# Patient Record
Sex: Female | Born: 1950 | Race: White | Hispanic: No | Smoking: Never smoker
Health system: Southern US, Community
[De-identification: ages and names within clinical notes are randomized; demographics above are authoritative.]

## PROBLEM LIST (undated history)

## (undated) DIAGNOSIS — J310 Chronic rhinitis: Secondary | ICD-10-CM

## (undated) DIAGNOSIS — M810 Age-related osteoporosis without current pathological fracture: Secondary | ICD-10-CM

## (undated) DIAGNOSIS — I10 Essential (primary) hypertension: Secondary | ICD-10-CM

## (undated) HISTORY — PX: COLONOSCOPY: SHX174

## (undated) HISTORY — PX: TUBAL LIGATION: SHX77

---

## 2010-11-27 ENCOUNTER — Ambulatory Visit: Payer: Self-pay | Admitting: Gastroenterology

## 2012-05-31 ENCOUNTER — Ambulatory Visit: Payer: Self-pay | Admitting: Family Medicine

## 2012-11-17 ENCOUNTER — Emergency Department: Payer: Self-pay | Admitting: Emergency Medicine

## 2013-06-06 ENCOUNTER — Ambulatory Visit: Payer: Self-pay | Admitting: Family Medicine

## 2014-08-22 ENCOUNTER — Ambulatory Visit: Payer: Self-pay | Admitting: Family Medicine

## 2015-07-28 ENCOUNTER — Other Ambulatory Visit: Payer: Self-pay | Admitting: Family Medicine

## 2015-07-28 DIAGNOSIS — Z1231 Encounter for screening mammogram for malignant neoplasm of breast: Secondary | ICD-10-CM

## 2015-08-25 ENCOUNTER — Ambulatory Visit
Admission: RE | Admit: 2015-08-25 | Discharge: 2015-08-25 | Disposition: A | Discharge status: Home / Self Care | Payer: BLUE CROSS/BLUE SHIELD | Source: Ambulatory Visit | Attending: Family Medicine | Admitting: Family Medicine

## 2015-08-25 ENCOUNTER — Ambulatory Visit: Payer: Self-pay

## 2015-08-25 DIAGNOSIS — Z1231 Encounter for screening mammogram for malignant neoplasm of breast: Secondary | ICD-10-CM | POA: Diagnosis not present

## 2016-02-11 ENCOUNTER — Encounter: Payer: Self-pay | Admitting: *Deleted

## 2016-02-12 ENCOUNTER — Ambulatory Visit
Admission: RE | Admit: 2016-02-12 | Discharge: 2016-02-12 | Disposition: A | Discharge status: Home / Self Care | Payer: BLUE CROSS/BLUE SHIELD | Source: Ambulatory Visit | Attending: Gastroenterology | Admitting: Gastroenterology

## 2016-02-12 ENCOUNTER — Ambulatory Visit: Payer: BLUE CROSS/BLUE SHIELD | Admitting: Anesthesiology

## 2016-02-12 ENCOUNTER — Encounter: Payer: Self-pay | Admitting: *Deleted

## 2016-02-12 ENCOUNTER — Encounter
Admission: RE | Disposition: A | Discharge status: Home / Self Care | Payer: Self-pay | Source: Ambulatory Visit | Attending: Gastroenterology

## 2016-02-12 DIAGNOSIS — Z1211 Encounter for screening for malignant neoplasm of colon: Secondary | ICD-10-CM | POA: Diagnosis not present

## 2016-02-12 DIAGNOSIS — Z79899 Other long term (current) drug therapy: Secondary | ICD-10-CM | POA: Insufficient documentation

## 2016-02-12 DIAGNOSIS — M810 Age-related osteoporosis without current pathological fracture: Secondary | ICD-10-CM | POA: Diagnosis not present

## 2016-02-12 DIAGNOSIS — Z8371 Family history of colonic polyps: Secondary | ICD-10-CM | POA: Insufficient documentation

## 2016-02-12 HISTORY — PX: COLONOSCOPY WITH PROPOFOL: SHX5780

## 2016-02-12 HISTORY — DX: Age-related osteoporosis without current pathological fracture: M81.0

## 2016-02-12 HISTORY — DX: Chronic rhinitis: J31.0

## 2016-02-12 SURGERY — COLONOSCOPY WITH PROPOFOL
Anesthesia: General

## 2016-02-12 MED ORDER — PROPOFOL 10 MG/ML IV BOLUS
INTRAVENOUS | Status: DC | PRN
  Administered 2016-02-12: 30 mg via INTRAVENOUS
  Administered 2016-02-12: 70 mg via INTRAVENOUS

## 2016-02-12 MED ORDER — SODIUM CHLORIDE 0.9 % IV SOLN: INTRAVENOUS | Status: DC

## 2016-02-12 MED ORDER — LIDOCAINE HCL (CARDIAC) 20 MG/ML IV SOLN
INTRAVENOUS | Status: DC | PRN
  Administered 2016-02-12: 50 mg via INTRAVENOUS

## 2016-02-12 MED ORDER — PROPOFOL 500 MG/50ML IV EMUL
INTRAVENOUS | Status: DC | PRN
  Administered 2016-02-12: 120 ug/kg/min via INTRAVENOUS

## 2016-02-12 MED ORDER — SODIUM CHLORIDE 0.9 % IV SOLN
INTRAVENOUS | Status: DC
  Administered 2016-02-12: 09:00:00 via INTRAVENOUS

## 2016-02-12 NOTE — Transfer of Care (Signed)
Immediate Anesthesia Transfer of Care Note  Patient: Paula Acosta  Procedure(s) Performed: Procedure(s): COLONOSCOPY WITH PROPOFOL (N/A)  Patient Location: Endoscopy Unit  Anesthesia Type:General  Level of Consciousness: awake  Airway & Oxygen Therapy: Patient Spontanous Breathing and Patient connected to nasal cannula oxygen  Post-op Assessment: Report given to RN  Post vital signs: Reviewed  Last Vitals:  Filed Vitals:   02/12/16 0907 02/12/16 1004  BP: 145/83 103/57  Pulse: 92 76  Temp: 36.1 C 36.1 C  Resp: 14 14    Last Pain: There were no vitals filed for this visit.       Complications: No apparent anesthesia complications

## 2016-02-12 NOTE — Op Note (Signed)
[facility] Patient Name: Paula Acosta Procedure Date: 02/12/2016 9:48 AM MRN: [id number] Account #: [id number] Date of Birth: 18-Mar-1951 Admit Type: Outpatient Age: 65 Room: [facility] ENDO ROOM 3 Gender: Female Note Status: Finalized Procedure:            Colonoscopy Indications:          Colon cancer screening in patient at increased risk:                        Family history of 1st-degree relative with colon polyps Providers:            Lupita Dawn. Candace Cruise, MD Referring MD:         Dion Body (Referring MD) Medicines:            Monitored Anesthesia Care Complications:        No immediate complications. Procedure:            Pre-Anesthesia Assessment:                       - Prior to the procedure, a History and Physical was                        performed, and patient medications, allergies and                        sensitivities were reviewed. The patient's tolerance of                        previous anesthesia was reviewed.                       - The risks and benefits of the procedure and the                        sedation options and risks were discussed with the                        patient. All questions were answered and informed                        consent was obtained.                       - After reviewing the risks and benefits, the patient                        was deemed in satisfactory condition to undergo the                        procedure.                       After obtaining informed consent, the colonoscope was                        passed under direct vision. Throughout the procedure,                        the patient's blood pressure, pulse, and oxygen  saturations were monitored continuously. The                        Colonoscope was introduced through the anus and                        advanced to the the cecum, identified by appendiceal                        orifice and ileocecal valve.  The colonoscopy was                        performed without difficulty. The patient tolerated the                        procedure well. Findings:      The colon (entire examined portion) appeared normal. Impression:           - The entire examined colon is normal.                       - No specimens collected. Recommendation:       - Discharge patient to home.                       - Repeat colonoscopy in 5 years for surveillance.                       - The findings and recommendations were discussed with                        the patient. Procedure Code(s):    --- Professional ---                       [phone], Colonoscopy, flexible; diagnostic, including                        collection of specimen(s) by brushing or washing, when                        performed (separate procedure) Diagnosis Code(s):    --- Professional ---                       Z83.71, Family history of colonic polyps CPT copyright 2016 American Medical Association. All rights reserved. The codes documented in this report are preliminary and upon coder review may  be revised to meet current compliance requirements. Hulen Luster, MD 02/12/2016 10:05:56 AM This report has been signed electronically. Number of Addenda: 0 Note Initiated On: 02/12/2016 9:48 AM Scope Withdrawal Time: 0 hours 3 minutes 26 seconds  Total Procedure Duration: 0 hours 6 minutes 45 seconds       [facility]

## 2016-02-12 NOTE — Anesthesia Preprocedure Evaluation (Signed)
Anesthesia Evaluation  Patient identified by MRN, date of birth, ID band Patient awake    Reviewed: Allergy & Precautions, H&P , NPO status , Patient's Chart, lab work & pertinent test results, reviewed documented beta blocker date and time   History of Anesthesia Complications Negative for: history of anesthetic complications  Airway Mallampati: II  TM Distance: >3 FB Neck ROM: full    Dental no notable dental hx. (+) Caps, Teeth Intact   Pulmonary neg pulmonary ROS,    Pulmonary exam normal breath sounds clear to auscultation       Cardiovascular Exercise Tolerance: Good negative cardio ROS Normal cardiovascular exam Rhythm:regular Rate:Normal     Neuro/Psych negative neurological ROS  negative psych ROS   GI/Hepatic negative GI ROS, Neg liver ROS,   Endo/Other  negative endocrine ROS  Renal/GU negative Renal ROS  negative genitourinary   Musculoskeletal   Abdominal   Peds  Hematology negative hematology ROS (+)   Anesthesia Other Findings Past Medical History:   Osteoporosis                                                 Rhinitis                                                     Reproductive/Obstetrics negative OB ROS                             Anesthesia Physical Anesthesia Plan  ASA: I  Anesthesia Plan: General   Post-op Pain Management:    Induction:   Airway Management Planned:   Additional Equipment:   Intra-op Plan:   Post-operative Plan:   Informed Consent: I have reviewed the patients History and Physical, chart, labs and discussed the procedure including the risks, benefits and alternatives for the proposed anesthesia with the patient or authorized representative who has indicated his/her understanding and acceptance.   Dental Advisory Given  Plan Discussed with: Anesthesiologist, CRNA and Surgeon  Anesthesia Plan Comments:         Anesthesia  Quick Evaluation

## 2016-02-12 NOTE — Anesthesia Postprocedure Evaluation (Signed)
Anesthesia Post Note  Patient: Paula Acosta  Procedure(s) Performed: Procedure(s) (LRB): COLONOSCOPY WITH PROPOFOL (N/A)  Patient location during evaluation: Endoscopy Anesthesia Type: General Level of consciousness: awake and alert Pain management: pain level controlled Vital Signs Assessment: post-procedure vital signs reviewed and stable Respiratory status: spontaneous breathing, nonlabored ventilation, respiratory function stable and patient connected to nasal cannula oxygen Cardiovascular status: blood pressure returned to baseline and stable Postop Assessment: no signs of nausea or vomiting Anesthetic complications: no    Last Vitals:  Filed Vitals:   02/12/16 1020 02/12/16 1030  BP: 118/74 141/80  Pulse:    Temp:    Resp:      Last Pain: There were no vitals filed for this visit.               Martha Clan

## 2016-02-12 NOTE — H&P (Signed)
    Primary Care Physician:  Dion Body, MD Primary Gastroenterologist:  Dr. Candace Cruise  Pre-Procedure History & Physical: HPI:  Paula Acosta is a 65 y.o. female is here for an colonoscopy   Past Medical History  Diagnosis Date  . Osteoporosis   . Rhinitis     Past Surgical History  Procedure Laterality Date  . Tubal ligation    . Colonoscopy      Prior to Admission medications   Medication Sig Start Date End Date Taking? Authorizing Provider  Calcium Carbonate (CALTRATE 600 PO) Take by mouth.   Yes Historical Provider, MD  docusate sodium (COLACE) 100 MG capsule Take 100 mg by mouth 2 (two) times daily.   Yes Historical Provider, MD  Multiple Vitamin (MULTIVITAMIN) capsule Take 1 capsule by mouth daily.   Yes Historical Provider, MD    Allergies as of 02/02/2016  . (Not on File)    History reviewed. No pertinent family history.  Social History   Social History  . Marital Status: Divorced    Spouse Name: N/A  . Number of Children: N/A  . Years of Education: N/A   Occupational History  . Not on file.   Social History Main Topics  . Smoking status: Never Smoker   . Smokeless tobacco: Never Used  . Alcohol Use: No  . Drug Use: No  . Sexual Activity: Not on file   Other Topics Concern  . Not on file   Social History Narrative    Review of Systems: See HPI, otherwise negative ROS  Physical Exam: There were no vitals taken for this visit. General:   Alert,  pleasant and cooperative in NAD Head:  Normocephalic and atraumatic. Neck:  Supple; no masses or thyromegaly. Lungs:  Clear throughout to auscultation.    Heart:  Regular rate and rhythm. Abdomen:  Soft, nontender and nondistended. Normal bowel sounds, without guarding, and without rebound.   Neurologic:  Alert and  oriented x4;  grossly normal neurologically.  Impression/Plan: Paula Acosta is here for an colonoscopy to be performed for screening, family hx of colon polyps  Risks, benefits,  limitations, and alternatives regarding  colonoscopy have been reviewed with the patient.  Questions have been answered.  All parties agreeable.   Leno Mathes, Lupita Dawn, MD  02/12/2016, 9:18 AM

## 2016-02-14 ENCOUNTER — Encounter: Payer: Self-pay | Admitting: Gastroenterology

## 2016-04-09 ENCOUNTER — Emergency Department
Admission: EM | Admit: 2016-04-09 | Discharge: 2016-04-09 | Disposition: A | Discharge status: Home / Self Care | Payer: BLUE CROSS/BLUE SHIELD | Attending: Emergency Medicine | Admitting: Emergency Medicine

## 2016-04-09 ENCOUNTER — Encounter: Payer: Self-pay | Admitting: Urgent Care

## 2016-04-09 DIAGNOSIS — M810 Age-related osteoporosis without current pathological fracture: Secondary | ICD-10-CM | POA: Diagnosis not present

## 2016-04-09 DIAGNOSIS — I160 Hypertensive urgency: Secondary | ICD-10-CM | POA: Diagnosis not present

## 2016-04-09 DIAGNOSIS — R51 Headache: Secondary | ICD-10-CM | POA: Diagnosis present

## 2016-04-09 LAB — BASIC METABOLIC PANEL
Anion gap: 8 (ref 5–15)
BUN: 18 mg/dL (ref 6–20)
CALCIUM: 10.4 mg/dL — AB (ref 8.9–10.3)
CHLORIDE: 104 mmol/L (ref 101–111)
CO2: 29 mmol/L (ref 22–32)
CREATININE: 1.03 mg/dL — AB (ref 0.44–1.00)
GFR calc Af Amer: >60 mL/min (ref >60)
GFR calc non Af Amer: 55 mL/min — ABNORMAL LOW (ref >60)
GLUCOSE: 105 mg/dL — AB (ref 65–99)
Potassium: 3.4 mmol/L — ABNORMAL LOW (ref 3.5–5.1)
Sodium: 141 mmol/L (ref 135–145)

## 2016-04-09 LAB — CBC WITH DIFFERENTIAL/PLATELET
Basophils Absolute: 0 10*3/uL (ref 0–0.1)
Basophils Relative: 1 %
Eosinophils Absolute: 0.1 10*3/uL (ref 0–0.7)
Eosinophils Relative: 1 %
HEMATOCRIT: 41.4 % (ref 35.0–47.0)
Hemoglobin: 14.6 g/dL (ref 12.0–16.0)
LYMPHS ABS: 2 10*3/uL (ref 1.0–3.6)
LYMPHS PCT: 27 %
MCH: 33.7 pg (ref 26.0–34.0)
MCHC: 35.3 g/dL (ref 32.0–36.0)
MCV: 95.4 fL (ref 80.0–100.0)
MONO ABS: 0.5 10*3/uL (ref 0.2–0.9)
MONOS PCT: 7 %
NEUTROS ABS: 4.8 10*3/uL (ref 1.4–6.5)
Neutrophils Relative %: 64 %
Platelets: 218 10*3/uL (ref 150–440)
RBC: 4.34 MIL/uL (ref 3.80–5.20)
RDW: 12.7 % (ref 11.5–14.5)
WBC: 7.5 10*3/uL (ref 3.6–11.0)

## 2016-04-09 MED ORDER — ACETAMINOPHEN 325 MG PO TABS
650.0000 mg | ORAL_TABLET | Freq: Once | ORAL | Status: AC
  Administered 2016-04-09: 650 mg via ORAL
  Filled 2016-04-09: qty 2

## 2016-04-09 MED ORDER — CLONIDINE HCL 0.1 MG PO TABS
0.2000 mg | ORAL_TABLET | Freq: Once | ORAL | Status: AC
  Administered 2016-04-09: 0.2 mg via ORAL
  Filled 2016-04-09: qty 2

## 2016-04-09 MED ORDER — CLONIDINE HCL 0.2 MG PO TABS: 0.2000 mg | ORAL_TABLET | Freq: Two times a day (BID) | ORAL | Status: DC

## 2016-04-09 NOTE — ED Notes (Signed)
Patient presents with a 2 weeks history of headaches. Patient reports that she has also appreciated some intermittent blurred vision. Patient went to [facility] today and was sent to the ED for further evaluation due to her having significantly elevated blood pressures; SBPs > 200; does not have a PMH that is significant for hypertension. Patient denies accompanying symptoms; no neck pain, SOB, pain in arms/back/shoulders, and no pain in her chest.

## 2016-04-09 NOTE — ED Provider Notes (Signed)
Time Seen: Approximately 2010  I have reviewed the triage notes  Chief Complaint: Headache and Hypertension   History of Present Illness: Paula Acosta is a 65 y.o. female *who was referred here by urgent care center for evaluation for hypertension and headaches. Patient states that occasionally her head feels "" for "". She thought she may have a sinus infection and went to the urgent care and was noted to have blood pressure systolic over [id number]. Denies any neck pain or stiffness she denies any focal weakness or photophobia. She states occasionally she had some trouble focusing with no visual field deficits. She denies any difficulty with ambulation, chest pain, abdominal pain, or any other new concerns. Geryl Councilman has no history of hypertension states that she does have a family history of hypertension   Past Medical History  Diagnosis Date  . Osteoporosis   . Rhinitis     There are no active problems to display for this patient.   Past Surgical History  Procedure Laterality Date  . Tubal ligation    . Colonoscopy    . Colonoscopy with propofol N/A 02/12/2016    Procedure: COLONOSCOPY WITH PROPOFOL;  Surgeon: Hulen Luster, MD;  Location: [facility] ENDOSCOPY;  Service: Gastroenterology;  Laterality: N/A;    Past Surgical History  Procedure Laterality Date  . Tubal ligation    . Colonoscopy    . Colonoscopy with propofol N/A 02/12/2016    Procedure: COLONOSCOPY WITH PROPOFOL;  Surgeon: Hulen Luster, MD;  Location: [facility] ENDOSCOPY;  Service: Gastroenterology;  Laterality: N/A;    Current Outpatient Rx  Name  Route  Sig  Dispense  Refill  . Calcium Carbonate (CALTRATE 600 PO)   Oral   Take by mouth.         . cloNIDine (CATAPRES) 0.2 MG tablet   Oral   Take 1 tablet (0.2 mg total) by mouth 2 (two) times daily.   60 tablet   0   . docusate sodium (COLACE) 100 MG capsule   Oral   Take 100 mg by mouth 2 (two) times daily.         . Multiple Vitamin (MULTIVITAMIN) capsule   Oral   Take  1 capsule by mouth daily.           Allergies:  Review of patient's allergies indicates no known allergies.  Family History: No family history on file.  Social History: Social History  Substance Use Topics  . Smoking status: Never Smoker   . Smokeless tobacco: Never Used  . Alcohol Use: No     Review of Systems:   10 point review of systems was performed and was otherwise negative:  Constitutional: No fever Eyes: No visual disturbances ENT: No sore throat, ear pain Cardiac: No chest pain Respiratory: No shortness of breath, wheezing, or stridor Abdomen: No abdominal pain, no vomiting, No diarrhea Endocrine: No weight loss, No night sweats Extremities: No peripheral edema, cyanosis Skin: No rashes, easy bruising Neurologic: No focal weakness, trouble with speech or swollowing Urologic: No dysuria, Hematuria, or urinary frequency   Physical Exam:  ED Triage Vitals  Enc Vitals Group     BP 04/09/16 1947 215/83 mmHg     Pulse Rate 04/09/16 1947 80     Resp 04/09/16 1947 18     Temp 04/09/16 1947 97.7 F (36.5 C)     Temp Source 04/09/16 1947 Oral     SpO2 04/09/16 1947 98 %     Weight 04/09/16 1947  115 lb (52.164 kg)     Height 04/09/16 1947 5\' 3"  (1.6 m)     Head Cir --      Peak Flow --      Pain Score 04/09/16 1952 0     Pain Loc --      Pain Edu? --      Excl. in [place]? --     General: Awake , Alert , and Oriented times 3; GCS 15 Head: Normal cephalic , atraumatic Eyes: Pupils equal , round, reactive to light Nose/Throat: No nasal drainage, patent upper airway without erythema or exudate.  Neck: Supple, Full range of motion, No anterior adenopathy or palpable thyroid masses Lungs: Clear to ascultation without wheezes , rhonchi, or rales Heart: Regular rate, regular rhythm without murmurs , gallops , or rubs Abdomen: Soft, non tender without rebound, guarding , or rigidity; bowel sounds positive and symmetric in all 4 quadrants. No organomegaly .         Extremities: 2 plus symmetric pulses. No edema, clubbing or cyanosis Neurologic: normal ambulation, Motor symmetric without deficits, sensory intact Skin: warm, dry, no rashes   Labs:   All laboratory work was reviewed including any pertinent negatives or positives listed below:  Labs Reviewed  BASIC METABOLIC PANEL - Abnormal; Notable for the following:    Potassium 3.4 (*)    Glucose, Bld 105 (*)    Creatinine, Ser 1.03 (*)    Calcium 10.4 (*)    GFR calc non Af Amer 55 (*)    All other components within normal limits  CBC WITH DIFFERENTIAL/PLATELET   Laboratory work was reviewed and showed no clinically significant abnormalities.  EKG:  ED ECG REPORT I, Daymon Larsen, the attending physician, personally viewed and interpreted this ECG.  Date: 04/09/2016 EKG Time: 2058 Rate: 65 Rhythm: normal sinus rhythm QRS Axis: normal Intervals: Early right bundle branch block pattern ST/T Wave abnormalities: normal Conduction Disturbances: none Narrative Interpretation: unremarkable No acute ischemic changes   ED Course:  Patient felt symptomatically improved and serial blood pressures were obtained after being given clonidine 0.2 mg. Patient states her headache is essentially resolved. She was also given some Tylenol here in emergency department. It was difficult to ascertain whether or not the headache created a hypertension or vice versa. Patient will be started on clonidine on an outpatient basis and was advised this is a transition-type medication until she can follow up with her primary physician. She was given hypertension instructions. I felt CAT scan of the head was not necessary at this time is patient's headache did not seem to be significant and she has no other symptoms indicative of an intracerebral bleed or subarachnoid hemorrhage etc.    Assessment:  Hypertensive urgency  Final Clinical Impression:  Final diagnoses:  Hypertensive urgency      Plan: Outpatient management Prescription for clonidine Patient was advised to return immediately if condition worsens. Patient was advised to follow up with their primary care physician or other specialized physicians involved in their outpatient care. The patient and/or family member/power of attorney had laboratory results reviewed at the bedside. All questions and concerns were addressed and appropriate discharge instructions were distributed by the nursing staff.           Daymon Larsen, MD 04/09/16 2218

## 2016-04-15 ENCOUNTER — Other Ambulatory Visit: Payer: Self-pay | Admitting: Family Medicine

## 2016-04-15 ENCOUNTER — Ambulatory Visit
Admission: RE | Admit: 2016-04-15 | Discharge: 2016-04-15 | Disposition: A | Discharge status: Home / Self Care | Payer: BLUE CROSS/BLUE SHIELD | Source: Ambulatory Visit | Attending: Family Medicine | Admitting: Family Medicine

## 2016-04-15 DIAGNOSIS — R51 Headache: Principal | ICD-10-CM

## 2016-04-15 DIAGNOSIS — I161 Hypertensive emergency: Secondary | ICD-10-CM | POA: Diagnosis not present

## 2016-04-15 DIAGNOSIS — R519 Headache, unspecified: Secondary | ICD-10-CM

## 2017-01-13 ENCOUNTER — Other Ambulatory Visit: Payer: Self-pay | Admitting: Family Medicine

## 2017-01-13 DIAGNOSIS — Z1231 Encounter for screening mammogram for malignant neoplasm of breast: Secondary | ICD-10-CM

## 2017-02-02 ENCOUNTER — Ambulatory Visit
Admission: RE | Admit: 2017-02-02 | Discharge: 2017-02-02 | Disposition: A | Discharge status: Home / Self Care | Payer: BLUE CROSS/BLUE SHIELD | Source: Ambulatory Visit | Attending: Family Medicine | Admitting: Family Medicine

## 2017-02-02 DIAGNOSIS — Z1231 Encounter for screening mammogram for malignant neoplasm of breast: Secondary | ICD-10-CM

## 2017-07-28 ENCOUNTER — Encounter: Payer: Self-pay | Admitting: Urology

## 2017-07-28 ENCOUNTER — Ambulatory Visit (INDEPENDENT_AMBULATORY_CARE_PROVIDER_SITE_OTHER): Payer: BLUE CROSS/BLUE SHIELD | Admitting: Urology

## 2017-07-28 VITALS — BP 148/80 | HR 78 | Ht 63.0 in | Wt 112.1 lb

## 2017-07-28 DIAGNOSIS — N3281 Overactive bladder: Secondary | ICD-10-CM | POA: Diagnosis not present

## 2017-07-28 LAB — URINALYSIS, COMPLETE
BILIRUBIN UA: NEGATIVE
GLUCOSE, UA: NEGATIVE
KETONES UA: NEGATIVE
Leukocytes, UA: NEGATIVE
NITRITE UA: NEGATIVE
Protein, UA: NEGATIVE
RBC, UA: NEGATIVE
Specific Gravity, UA: <1.005 — ABNORMAL LOW (ref 1.005–1.030)
UUROB: 0.2 mg/dL (ref 0.2–1.0)
pH, UA: 5.5 (ref 5.0–7.5)

## 2017-07-28 MED ORDER — MIRABEGRON ER 25 MG PO TB24: 25.0000 mg | ORAL_TABLET | Freq: Every day | ORAL | 11 refills | Status: DC

## 2017-07-28 MED ORDER — URIBEL 118 MG PO CAPS: ORAL_CAPSULE | ORAL | 11 refills | Status: DC

## 2017-07-28 NOTE — Progress Notes (Signed)
07/28/2017 1:46 PM   Paula Acosta Dec 29, 1950 [id number]  Referring provider: Dion Body, MD [facility] [place], [place] [address]  Chief Complaint  Patient presents with  . New Patient (Initial Visit)    Cystitis    HPI: The patient is a 66 year old female with past medical history significant to IBS who presents today for evaluation of urinary symptoms.  She notes her biggest complaints are urgency with incontinence, suprapubic pressure and discomfort, and stress urinary with urge incontinence multiple times per day.  She finds this particularly bothersome.  She does drink incontinence.  In terms of her urgency she has urgency up to 3 caffeine beverages per day.  She also has developed what she describes a CPAP pressure and discomfort.  She initially thought it was her IBS.  She did noted improves with voiding and worsens when her bladder is full.  She is bothered by this.  She has had a UTI in the past that was culture positive which she said the symptoms were worse during that episode.  Nonetheless this is bothersome to her quality of life.  She does endorse mild leakage with coughing and sneezing.  This is mildly bothersome.  She is most concerned by urgency and suprapubic discomfort.  Per PCP, most recent urine culture was negative.  Urinalysis today is unremarkable.  PMH: Past Medical History:  Diagnosis Date  . Osteoporosis   . Rhinitis     Surgical History: Past Surgical History:  Procedure Laterality Date  . COLONOSCOPY    . COLONOSCOPY WITH PROPOFOL N/A 02/12/2016   Procedure: COLONOSCOPY WITH PROPOFOL;  Surgeon: Hulen Luster, MD;  [facility] ENDOSCOPY;  Service: Gastroenterology;  Laterality: N/A;  . TUBAL LIGATION      Home Medications:  Allergies as of 07/28/2017   No Known Allergies     Medication List       Accurate as of 07/28/17  1:46 PM. Always use your most recent med list.          CALTRATE 600 PO Take by  mouth.   docusate sodium 100 MG capsule Commonly known as:  COLACE Take 100 mg by mouth 2 (two) times daily.   Fish Oil 1000 MG Caps Take by mouth.   losartan 50 MG tablet Commonly known as:  COZAAR Take 50 mg by mouth daily.   mirabegron ER 25 MG Tb24 tablet Commonly known as:  MYRBETRIQ Take 1 tablet (25 mg total) by mouth daily.   multivitamin capsule Take 1 capsule by mouth daily.   URIBEL 118 MG Caps Take 1 to 4 tabs PO daily prn       Allergies: No Known Allergies  Family History: Family History  Problem Relation Age of Onset  . Breast cancer Neg Hx   . Prostate cancer Neg Hx   . Bladder Cancer Neg Hx   . Kidney cancer Neg Hx     Social History:  reports that she has never smoked. She has never used smokeless tobacco. She reports that she does not drink alcohol or use drugs.  ROS: UROLOGY Frequent Urination?: Yes Hard to postpone urination?: Yes Burning/pain with urination?: Yes Get up at night to urinate?: Yes Leakage of urine?: Yes Urine stream starts and stops?: No Trouble starting stream?: No Do you have to strain to urinate?: No Blood in urine?: Yes Urinary tract infection?: Yes Sexually transmitted disease?: No Injury to kidneys or bladder?: No Painful intercourse?: No Weak stream?: No Currently pregnant?:  No Vaginal bleeding?: No Last menstrual period?: n  Gastrointestinal Nausea?: No Vomiting?: No Indigestion/heartburn?: No Diarrhea?: No Constipation?: Yes  Constitutional Fever: No Night sweats?: No Weight loss?: No Fatigue?: No  Skin Skin rash/lesions?: No Itching?: No  Eyes Blurred vision?: Yes Double vision?: No  Ears/Nose/Throat Sore throat?: No Sinus problems?: Yes  Hematologic/Lymphatic Swollen glands?: No Easy bruising?: No  Cardiovascular Leg swelling?: No Chest pain?: No  Respiratory Cough?: No Shortness of breath?: No  Endocrine Excessive thirst?: No  Musculoskeletal Back pain?: No Joint  pain?: No  Neurological Headaches?: No Dizziness?: No  Psychologic Depression?: No Anxiety?: No  Physical Exam: BP (!) 148/80 (BP Location: Right Arm, Patient Position: Sitting, Cuff Size: Normal)   Pulse 78   Ht 5\' 3"  (1.6 m)   Wt 112 lb 1.6 oz (50.8 kg)   BMI 19.86 kg/m   Constitutional:  Alert and oriented, No acute distress. HEENT: [place] AT, moist mucus membranes.  Trachea midline, no masses. Cardiovascular: No clubbing, cyanosis, or edema. Respiratory: Normal respiratory effort, no increased work of breathing. GI: Abdomen is soft, nontender, nondistended, no abdominal masses GU: No CVA tenderness.  Skin: No rashes, bruises or suspicious lesions. Lymph: No cervical or inguinal adenopathy. Neurologic: Grossly intact, no focal deficits, moving all 4 extremities. Psychiatric: Normal mood and affect.  Laboratory Data: Lab Results  Component Value Date   WBC 7.5 04/09/2016   HGB 14.6 04/09/2016   HCT 41.4 04/09/2016   MCV 95.4 04/09/2016   PLT 218 04/09/2016    Lab Results  Component Value Date   CREATININE 1.03 (H) 04/09/2016    No results found for: PSA  No results found for: TESTOSTERONE  No results found for: HGBA1C  Urinalysis No results found for: COLORURINE, APPEARANCEUR, LABSPEC, PHURINE, GLUCOSEU, HGBUR, BILIRUBINUR, KETONESUR, PROTEINUR, UROBILINOGEN, NITRITE, LEUKOCYTESUR   Assessment & Plan:    1.  Urinary urgency We will try Myrbetriq 25 mg daily.  If this is cost prohibitive, we can switch to an anticholinergic.  2.  Suprapubic discomfort This may be secondary to interstitial cystitis as she has had multiple negative urine cultures.  We did discuss that this process of IC is similar to her IBS.  We did discuss that the goal is symptomatic management.  I have started her on Uribel to help with her bladder discomfort.  We will plan for cystoscopy as well due to likely IC at follow-up to ensure no other disease process is present within her  bladder.  3.  Stress urinary incontinence I did discuss with the patient that this is a surgical disease and any treatment for it would worsen her above problems.  We will hold off on further workup of this for now until above is improved.  Return in about 6 weeks (around 09/08/2017) for cysto.  Nickie Retort, MD  [facility] [address], [place], [place] [address] [phone]

## 2017-08-24 ENCOUNTER — Telehealth: Payer: Self-pay | Admitting: Urology

## 2017-08-24 NOTE — Telephone Encounter (Signed)
Patient called and cx her cysto and did not want to reschd   Sharyn Lull

## 2017-09-08 ENCOUNTER — Other Ambulatory Visit: Payer: BLUE CROSS/BLUE SHIELD

## 2018-01-06 ENCOUNTER — Other Ambulatory Visit: Payer: Self-pay | Admitting: Family Medicine

## 2018-01-06 DIAGNOSIS — Z1231 Encounter for screening mammogram for malignant neoplasm of breast: Secondary | ICD-10-CM

## 2018-02-06 ENCOUNTER — Telehealth: Payer: Self-pay | Admitting: Urology

## 2018-02-06 NOTE — Telephone Encounter (Signed)
Pt was seen once as a NP in Oct 2018, stated she was given Rx for Mybetriq and Uribel, when pt went to pharmacy it was going to cost her over $700 so she didn't fill the medication.  Pt calls today asking if there is something else that can be prescribed to her at a cheaper cost.  Please advise Pt. Thanks.

## 2018-02-08 ENCOUNTER — Ambulatory Visit
Admission: RE | Admit: 2018-02-08 | Discharge: 2018-02-08 | Disposition: A | Discharge status: Home / Self Care | Payer: BLUE CROSS/BLUE SHIELD | Source: Ambulatory Visit | Attending: Family Medicine | Admitting: Family Medicine

## 2018-02-08 DIAGNOSIS — Z1231 Encounter for screening mammogram for malignant neoplasm of breast: Secondary | ICD-10-CM | POA: Diagnosis present

## 2018-02-09 ENCOUNTER — Telehealth: Payer: Self-pay | Admitting: Urology

## 2018-02-09 MED ORDER — SOLIFENACIN SUCCINATE 5 MG PO TABS: 5.0000 mg | ORAL_TABLET | Freq: Every day | ORAL | 5 refills | Status: DC

## 2018-02-09 NOTE — Telephone Encounter (Signed)
Pt was told to call back to let you know insurance would not cover Rx and she would like to try generic.

## 2018-02-09 NOTE — Telephone Encounter (Signed)
Vesicare was sent to pharmacy

## 2018-02-09 NOTE — Telephone Encounter (Signed)
Pt informed, she will use AZO and go through insurance if too expensive for new rx.

## 2019-02-23 ENCOUNTER — Inpatient Hospital Stay: Payer: BLUE CROSS/BLUE SHIELD | Attending: Gynecologic Oncology | Admitting: Gynecologic Oncology

## 2019-02-23 ENCOUNTER — Inpatient Hospital Stay: Payer: BLUE CROSS/BLUE SHIELD

## 2019-02-23 ENCOUNTER — Other Ambulatory Visit: Payer: Self-pay

## 2019-02-23 ENCOUNTER — Encounter: Payer: Self-pay | Admitting: Gynecologic Oncology

## 2019-02-23 VITALS — BP 158/88 | HR 80 | Temp 97.3°F | Resp 20 | Ht 62.0 in | Wt 113.0 lb

## 2019-02-23 DIAGNOSIS — R1900 Intra-abdominal and pelvic swelling, mass and lump, unspecified site: Secondary | ICD-10-CM

## 2019-02-23 MED ORDER — SENNOSIDES-DOCUSATE SODIUM 8.6-50 MG PO TABS: 2.0000 | ORAL_TABLET | Freq: Every day | ORAL | 0 refills | Status: DC

## 2019-02-23 MED ORDER — OXYCODONE HCL 5 MG PO TABS: 5.0000 mg | ORAL_TABLET | ORAL | 0 refills | Status: DC | PRN

## 2019-02-23 MED ORDER — IBUPROFEN 600 MG PO TABS: 600.0000 mg | ORAL_TABLET | Freq: Four times a day (QID) | ORAL | 0 refills | Status: DC | PRN

## 2019-02-23 NOTE — Progress Notes (Signed)
Consult Note: Gyn-Onc  Consult was requested by Dr. Matilde Sprang for the evaluation of Paula Acosta 68 y.o. female  CC:  Chief Complaint  Patient presents with  . Pelvic mass    Assessment/Plan:  Paula Acosta  is a 68 y.o.  year old with a large pelvic cystic mass with some solid area in the inferior aspect.   I reviewed the CT findings with the patient and her daughter.  I discussed that what was reassuring about the appearance of the cyst was that it was mostly fluid-filled, regular in its surface contour, and not associated with extraovarian abnormalities such as ascites or carcinomatosis and omental caking.  However given the solid component to the cystic mass with some enhanced uptake on contrasted imaging and her postmenopausal age, there is some concern that a primary ovarian malignancy or borderline tumor might be present.  If so it is likely a clinical stage I.  We will first obtain tumor markers with Ca1 25 and CEA to better characterize the likelihood of malignancy versus benign.  I recommend surgical evaluation for definitive pathology.  If tumor markers are normal we will plan for a mini lap with BSO, possible hysterectomy and staging if malignancy is identified on frozen section.  If tumor markers are elevated we will plan on a mini lap for control decompression followed by robotic hysterectomy BSO and possible staging pending frozen section results.  The patient is in agreement with this plan I discussed postoperative healing and recovery.  I discussed operative risks including  bleeding, infection, damage to internal organs (such as bladder,ureters, bowels), blood clot, reoperation and rehospitalization. I discussed the risk for coronavirus and increased risk of death if the patient has surgery while infected with coronavirus, therefore she is instructed to quarantine for 2 weeks preoperatively and will be tested preoperatively for COVID-19 and if positive will have her surgery  delayed.   HPI: Paula Acosta is a very pleasant 68 year old P1 who is seen in consultation at the request of Dr. Matilde Sprang for a large (15cm) complex cystic mass.  The patient has a longstanding history of IBS.  This became somewhat worse and she thought she had persistent gas pains she also had some urinary frequency.  She was referred to a urologist for evaluation of this.  An abdominal ultrasound was performed after voiding and it was felt initially she might have a poised post void residual.  She then underwent cystoscopy which is grossly normal in the bladder.  Followed by a CT scan of the abdomen and pelvis with IV contrast.  The CT scan was performed on Feb 20, 2019.  It revealed what appeared to be benign hepatic cysts.  No lymphadenopathy, scant pelvic ascites, no omental disease or carcinomatosis.  There is a large complex cystic pelvic mass measuring approximately 15 cm in largest dimension that appears to be arising from the left ovary.  It is somewhat septated with an area of irregular nodular enhancement inferiorly measuring up to 5.4 cm.  The patient is otherwise a fairly healthy woman.  She has some mild hypertension and IBS.  She has had no prior abdominal surgeries.  She has had one prior vaginal delivery.  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  She owns a beach house at [facility] which she rents on air [address] and hopes to retire 2.  Her mother had a diagnosis of leukemia but she has  no family history of breast ovarian or colon cancer.  She has no vaginal bleeding symptoms.  Current Meds:  Outpatient Encounter Medications as of 02/23/2019  Medication Sig  . losartan (COZAAR) 50 MG tablet Take 50 mg by mouth daily.  . Multiple Vitamin (MULTIVITAMIN) capsule Take 1 capsule by mouth daily.  . Omega-3 Fatty Acids (FISH OIL) 1000 MG CAPS Take by mouth.  . dicyclomine (BENTYL) 20 MG tablet Take 20 mg by mouth every 6 (six) hours.  . docusate  sodium (COLACE) 100 MG capsule Take 100 mg by mouth 2 (two) times daily.  Marland Kitchen ibuprofen (ADVIL) 600 MG tablet Take 1 tablet (600 mg total) by mouth every 6 (six) hours as needed. For AFTER surgery  . oxyCODONE (OXY IR/ROXICODONE) 5 MG immediate release tablet Take 1 tablet (5 mg total) by mouth every 4 (four) hours as needed for severe pain. For AFTER surgery, do not take and drive  . senna-docusate (SENOKOT-S) 8.6-50 MG tablet Take 2 tablets by mouth at bedtime. For AFTER surgery, do not take if having diarrhea  . [DISCONTINUED] Calcium Carbonate (CALTRATE 600 PO) Take by mouth.  . [DISCONTINUED] Meth-Hyo-M Bl-Na Phos-Ph Sal (URIBEL) 118 MG CAPS Take 1 to 4 tabs PO daily prn  . [DISCONTINUED] mirabegron ER (MYRBETRIQ) 25 MG TB24 tablet Take 1 tablet (25 mg total) by mouth daily.  . [DISCONTINUED] solifenacin (VESICARE) 5 MG tablet Take 1 tablet (5 mg total) by mouth daily.   No facility-administered encounter medications on file as of 02/23/2019.     Allergy: No Known Allergies  Social Hx:   Social History   Socioeconomic History  . Marital status: Divorced    Spouse name: Not on file  . Number of children: Not on file  . Years of education: Not on file  . Highest education level: Not on file  Occupational History  . Not on file  Social Needs  . Financial resource strain: Not on file  . Food insecurity:    Worry: Not on file    Inability: Not on file  . Transportation needs:    Medical: Not on file    Non-medical: Not on file  Tobacco Use  . Smoking status: Never Smoker  . Smokeless tobacco: Never Used  Substance and Sexual Activity  . Alcohol use: No  . Drug use: No  . Sexual activity: Yes  Lifestyle  . Physical activity:    Days per week: Not on file    Minutes per session: Not on file  . Stress: Not on file  Relationships  . Social connections:    Talks on phone: Not on file    Gets together: Not on file    Attends religious service: Not on file    Active member of  club or organization: Not on file    Attends meetings of clubs or organizations: Not on file    Relationship status: Not on file  . Intimate partner violence:    Fear of current or ex partner: Not on file    Emotionally abused: Not on file    Physically abused: Not on file    Forced sexual activity: Not on file  Other Topics Concern  . Not on file  Social History Narrative  . Not on file    Past Surgical Hx:  Past Surgical History:  Procedure Laterality Date  . COLONOSCOPY    . COLONOSCOPY WITH PROPOFOL N/A 02/12/2016   Procedure: COLONOSCOPY WITH PROPOFOL;  Surgeon: Hulen Luster, MD;  Location: [facility] ENDOSCOPY;  Service: Gastroenterology;  Laterality: N/A;  . TUBAL LIGATION      Past Medical Hx:  Past Medical History:  Diagnosis Date  . Osteoporosis   . Rhinitis     Past Gynecological History:  P1, SVD No LMP recorded. Patient is postmenopausal.  Family Hx:  Family History  Problem Relation Age of Onset  . Stomach cancer Mother   . Leukemia Father   . ALS Sister   . Colon cancer Maternal Aunt   . Colon cancer Maternal Aunt   . Leukemia Cousin   . Breast cancer Neg Hx   . Prostate cancer Neg Hx   . Bladder Cancer Neg Hx   . Kidney cancer Neg Hx     Review of Systems:  Constitutional  Feels well,    ENT Normal appearing ears and nares bilaterally Skin/Breast  No rash, sores, jaundice, itching, dryness Cardiovascular  No chest pain, shortness of breath, or edema  Pulmonary  No cough or wheeze.  Gastro Intestinal  No nausea, vomitting, or diarrhoea. No bright red blood per rectum, no abdominal pain, change in bowel movement, or constipation. + low pelvic swelling and pressure Genito Urinary  + frequency, no hematuria, no bleeding Musculo Skeletal  No myalgia, arthralgia, joint swelling or pain  Neurologic  No weakness, numbness, change in gait,  Psychology  No depression, anxiety, insomnia.   Vitals:  Blood pressure (!) 158/88, pulse 80, temperature (!)  97.3 F (36.3 C), temperature source Oral, resp. rate 20, height 5\' 2"  (1.575 m), weight 113 lb (51.3 kg), SpO2 100 %.  Physical Exam: WD in NAD Neck  Supple NROM, without any enlargements.  Lymph Node Survey No cervical supraclavicular or inguinal adenopathy Cardiovascular  Pulse normal rate, regularity and rhythm. S1 and S2 normal.  Lungs  Clear to auscultation bilateraly, without wheezes/crackles/rhonchi. Good air movement.  Skin  No rash/lesions/breakdown  Psychiatry  Alert and oriented to person, place, and time  Abdomen  Normoactive bowel sounds, abdomen soft, non-tender and thin without evidence of hernia. Suprapubic cystic swelling (nonmobile). Back No CVA tenderness Genito Urinary  Vulva/vagina: Normal external female genitalia.   No lesions. No discharge or bleeding.  Bladder/urethra:  No lesions or masses, well supported bladder  Vagina: normal  Cervix: Normal appearing, no lesions.  Uterus: Tilted anteriorally. Small, mobile, no parametrial involvement or nodularity.  Adnexa: Smooth cystic nonmobile mass in posterior cul de sac. Rectal  Good tone, no masses no cul de sac nodularity.  Extremities  No bilateral cyanosis, clubbing or edema.   Thereasa Solo, MD  02/23/2019, 11:40 AM

## 2019-02-23 NOTE — Patient Instructions (Signed)
We will be checking labs (tumor markers) today and will contact you with the results.  Preparing for your Surgery  Plan for surgery on March 15, 2019 with Dr. Everitt Amber at [facility] will be scheduled for a robotic assisted total laparoscopic hysterectomy, bilateral salpingo-oophorectomy, mini-laparotomy, possible staging.   Pre-operative Testing -You will receive a phone call from presurgical testing at [facility] if you have not received a call already to arrange for a pre-operative testing appointment before your surgery.  This appointment normally occurs one to two weeks before your scheduled surgery.   -Bring your insurance card, copy of an advanced directive if applicable, medication list  -At that visit, you will be asked to sign a consent for a possible blood transfusion in case a transfusion becomes necessary during surgery.  The need for a blood transfusion is rare but having consent is a necessary part of your care.     -You should not be taking blood thinners or aspirin at least ten days prior to surgery unless instructed by your surgeon.  -Do not take supplements such as fish oil (omega 3), red yeast rice, tumeric before your surgery.  Day Before Surgery at [place] will be asked to take in a light diet the day before surgery.  Avoid carbonated beverages.  You will be advised to have nothing to eat or drink after midnight the evening before.    Eat a light diet the day before surgery.  Examples including soups, broths, toast, yogurt, mashed potatoes.  Things to avoid include carbonated beverages (fizzy beverages), raw fruits and raw vegetables, or beans.   If your bowels are filled with gas, your surgeon will have difficulty visualizing your pelvic organs which increases your surgical risks.  Your role in recovery Your role is to become active as soon as directed by your doctor, while still giving yourself time to heal.  Rest when you feel tired. You  will be asked to do the following in order to speed your recovery:  - Cough and breathe deeply. This helps to clear and expand your lungs and can prevent pneumonia. You may be given a spirometer to practice deep breathing. A staff member will show you how to use the spirometer. - Do mild physical activity. Walking or moving your legs help your circulation and body functions return to normal. A staff member will help you when you try to walk and will provide you with simple exercises. Do not try to get up or walk alone the first time. - Actively manage your pain. Managing your pain lets you move in comfort. We will ask you to rate your pain on a scale of zero to 10. It is your responsibility to tell your doctor or nurse where and how much you hurt so your pain can be treated.  Special Considerations -If you are diabetic, you may be placed on insulin after surgery to have closer control over your blood sugars to promote healing and recovery.  This does not mean that you will be discharged on insulin.  If applicable, your oral antidiabetics will be resumed when you are tolerating a solid diet.  -Your final pathology results from surgery should be available around one week after surgery and the results will be relayed to you when available.  -Dr. Lahoma Crocker is the surgeon that assists your GYN Oncologist with surgery.  If you end up staying the night, the next day after your surgery you will either see Dr.  Denman George or Dr. Lahoma Crocker.  -FMLA forms can be faxed to [phone] and please allow 5-7 business days for completion.  Pain Management After Surgery -You have been prescribed your pain medication and bowel regimen medications before surgery so that you can have these available when you are discharged from the hospital. The pain medication is for use ONLY AFTER surgery and a new prescription will not be given.   -Make sure that you have Tylenol and Ibuprofen at home to use on a regular  basis after surgery for pain control. We recommend alternating the medications every hour to six hours since they work differently and are processed in the body differently for pain relief.  -Review the attached handout on narcotic use and their risks and side effects.   Bowel Regimen -You have been prescribed Sennakot-S to take nightly to prevent constipation especially if you are taking the narcotic pain medication intermittently.  It is important to prevent constipation and drink adequate amounts of liquids.  Blood Transfusion Information WHAT IS A BLOOD TRANSFUSION? A transfusion is the replacement of blood or some of its parts. Blood is made up of multiple cells which provide different functions.  Red blood cells carry oxygen and are used for blood loss replacement.  Jons blood cells fight against infection.  Platelets control bleeding.  Plasma helps clot blood.  Other blood products are available for specialized needs, such as hemophilia or other clotting disorders. BEFORE THE TRANSFUSION  Who gives blood for transfusions?   You may be able to donate blood to be used at a later date on yourself (autologous donation).  Relatives can be asked to donate blood. This is generally not any safer than if you have received blood from a stranger. The same precautions are taken to ensure safety when a relative's blood is donated.  Healthy volunteers who are fully evaluated to make sure their blood is safe. This is blood bank blood. Transfusion therapy is the safest it has ever been in the practice of medicine. Before blood is taken from a donor, a complete history is taken to make sure that person has no history of diseases nor engages in risky social behavior (examples are intravenous drug use or sexual activity with multiple partners). The donor's travel history is screened to minimize risk of transmitting infections, such as malaria. The donated blood is tested for signs of infectious  diseases, such as HIV and hepatitis. The blood is then tested to be sure it is compatible with you in order to minimize the chance of a transfusion reaction. If you or a relative donates blood, this is often done in anticipation of surgery and is not appropriate for emergency situations. It takes many days to process the donated blood. RISKS AND COMPLICATIONS Although transfusion therapy is very safe and saves many lives, the main dangers of transfusion include:   Getting an infectious disease.  Developing a transfusion reaction. This is an allergic reaction to something in the blood you were given. Every precaution is taken to prevent this. The decision to have a blood transfusion has been considered carefully by your caregiver before blood is given. Blood is not given unless the benefits outweigh the risks.  AFTER SURGERY CONSIDERATIONS  Return to work: 4-6 weeks if applicable  Activity: 1. Be up and out of the bed during the day.  Take a nap if needed.  You may walk up steps but be careful and use the hand rail.  Stair climbing will tire you more  than you think, you may need to stop part way and rest.   2. No lifting or straining for 6 weeks.  3. No driving for 1 week(s).  Do not drive if you are taking narcotic pain medicine.  4. Shower daily.  Use soap and water on your incision and pat dry; don't rub.  No tub baths until cleared by your surgeon.   5. No sexual activity and nothing in the vagina for 8 weeks.  6. You may experience a small amount of clear drainage from your incisions, which is normal.  If the drainage persists or increases, please call the office.  7. You may experience vaginal spotting after surgery or around the 6-8 week mark from surgery when the stitches at the top of the vagina begin to dissolve.  The spotting is normal but if you experience heavy bleeding, call our office.  8. Take Tylenol or ibuprofen first for pain and only use Percocet for severe pain not  relieved by the Tylenol or Ibuprofen.  Monitor your Tylenol intake to a max of 4,000 mg a day since Percocet has Tylenol in it as well.  Diet: 1. Low sodium Heart Healthy Diet is recommended.  2. It is safe to use a laxative, such as Miralax or Colace, if you have difficulty moving your bowels. You can take Sennakot at bedtime every evening to keep bowel movements regular and to prevent constipation.    Wound Care: 1. Keep clean and dry.  Shower daily.  Reasons to call the Doctor:  Fever - Oral temperature greater than 100.4 degrees Fahrenheit  Foul-smelling vaginal discharge  Difficulty urinating  Nausea and vomiting  Increased pain at the site of the incision that is unrelieved with pain medicine.  Difficulty breathing with or without chest pain  New calf pain especially if only on one side  Sudden, continuing increased vaginal bleeding with or without clots.

## 2019-02-23 NOTE — H&P (View-Only) (Signed)
Consult Note: Gyn-Onc  Consult was requested by Dr. Matilde Sprang for the evaluation of Paula Acosta 68 y.o. female  CC:  Chief Complaint  Patient presents with  . Pelvic mass    Assessment/Plan:  Paula Acosta  is a 68 y.o.  year old with a large pelvic cystic mass with some solid area in the inferior aspect.   I reviewed the CT findings with the patient and her daughter.  I discussed that what was reassuring about the appearance of the cyst was that it was mostly fluid-filled, regular in its surface contour, and not associated with extraovarian abnormalities such as ascites or carcinomatosis and omental caking.  However given the solid component to the cystic mass with some enhanced uptake on contrasted imaging and her postmenopausal age, there is some concern that a primary ovarian malignancy or borderline tumor might be present.  If so it is likely a clinical stage I.  We will first obtain tumor markers with Ca1 25 and CEA to better characterize the likelihood of malignancy versus benign.  I recommend surgical evaluation for definitive pathology.  If tumor markers are normal we will plan for a mini lap with BSO, possible hysterectomy and staging if malignancy is identified on frozen section.  If tumor markers are elevated we will plan on a mini lap for control decompression followed by robotic hysterectomy BSO and possible staging pending frozen section results.  The patient is in agreement with this plan I discussed postoperative healing and recovery.  I discussed operative risks including  bleeding, infection, damage to internal organs (such as bladder,ureters, bowels), blood clot, reoperation and rehospitalization. I discussed the risk for coronavirus and increased risk of death if the patient has surgery while infected with coronavirus, therefore she is instructed to quarantine for 2 weeks preoperatively and will be tested preoperatively for COVID-19 and if positive will have her surgery  delayed.   HPI: Paula Acosta is a very pleasant 68 year old P1 who is seen in consultation at the request of Dr. Matilde Sprang for a large (15cm) complex cystic mass.  The patient has a longstanding history of IBS.  This became somewhat worse and she thought she had persistent gas pains she also had some urinary frequency.  She was referred to a urologist for evaluation of this.  An abdominal ultrasound was performed after voiding and it was felt initially she might have a poised post void residual.  She then underwent cystoscopy which is grossly normal in the bladder.  Followed by a CT scan of the abdomen and pelvis with IV contrast.  The CT scan was performed on Feb 20, 2019.  It revealed what appeared to be benign hepatic cysts.  No lymphadenopathy, scant pelvic ascites, no omental disease or carcinomatosis.  There is a large complex cystic pelvic mass measuring approximately 15 cm in largest dimension that appears to be arising from the left ovary.  It is somewhat septated with an area of irregular nodular enhancement inferiorly measuring up to 5.4 cm.  The patient is otherwise a fairly healthy woman.  She has some mild hypertension and IBS.  She has had no prior abdominal surgeries.  She has had one prior vaginal delivery.  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  She owns a [place] house at [facility] which she rents on air [address] and hopes to retire 2.  Her mother had a diagnosis of leukemia but she has  no family history of breast ovarian or colon cancer.  She has no vaginal bleeding symptoms.  Current Meds:  Outpatient Encounter Medications as of 02/23/2019  Medication Sig  . losartan (COZAAR) 50 MG tablet Take 50 mg by mouth daily.  . Multiple Vitamin (MULTIVITAMIN) capsule Take 1 capsule by mouth daily.  . Omega-3 Fatty Acids (FISH OIL) 1000 MG CAPS Take by mouth.  . dicyclomine (BENTYL) 20 MG tablet Take 20 mg by mouth every 6 (six) hours.  . docusate  sodium (COLACE) 100 MG capsule Take 100 mg by mouth 2 (two) times daily.  Marland Kitchen ibuprofen (ADVIL) 600 MG tablet Take 1 tablet (600 mg total) by mouth every 6 (six) hours as needed. For AFTER surgery  . oxyCODONE (OXY IR/ROXICODONE) 5 MG immediate release tablet Take 1 tablet (5 mg total) by mouth every 4 (four) hours as needed for severe pain. For AFTER surgery, do not take and drive  . senna-docusate (SENOKOT-S) 8.6-50 MG tablet Take 2 tablets by mouth at bedtime. For AFTER surgery, do not take if having diarrhea  . [DISCONTINUED] Calcium Carbonate (CALTRATE 600 PO) Take by mouth.  . [DISCONTINUED] Meth-Hyo-M Bl-Na Phos-Ph Sal (URIBEL) 118 MG CAPS Take 1 to 4 tabs PO daily prn  . [DISCONTINUED] mirabegron ER (MYRBETRIQ) 25 MG TB24 tablet Take 1 tablet (25 mg total) by mouth daily.  . [DISCONTINUED] solifenacin (VESICARE) 5 MG tablet Take 1 tablet (5 mg total) by mouth daily.   No facility-administered encounter medications on file as of 02/23/2019.     Allergy: No Known Allergies  Social Hx:   Social History   Socioeconomic History  . Marital status: Divorced    Spouse name: Not on file  . Number of children: Not on file  . Years of education: Not on file  . Highest education level: Not on file  Occupational History  . Not on file  Social Needs  . Financial resource strain: Not on file  . Food insecurity:    Worry: Not on file    Inability: Not on file  . Transportation needs:    Medical: Not on file    Non-medical: Not on file  Tobacco Use  . Smoking status: Never Smoker  . Smokeless tobacco: Never Used  Substance and Sexual Activity  . Alcohol use: No  . Drug use: No  . Sexual activity: Yes  Lifestyle  . Physical activity:    Days per week: Not on file    Minutes per session: Not on file  . Stress: Not on file  Relationships  . Social connections:    Talks on phone: Not on file    Gets together: Not on file    Attends religious service: Not on file    Active member of  club or organization: Not on file    Attends meetings of clubs or organizations: Not on file    Relationship status: Not on file  . Intimate partner violence:    Fear of current or ex partner: Not on file    Emotionally abused: Not on file    Physically abused: Not on file    Forced sexual activity: Not on file  Other Topics Concern  . Not on file  Social History Narrative  . Not on file    Past Surgical Hx:  Past Surgical History:  Procedure Laterality Date  . COLONOSCOPY    . COLONOSCOPY WITH PROPOFOL N/A 02/12/2016   Procedure: COLONOSCOPY WITH PROPOFOL;  Surgeon: Hulen Luster, MD;  Location: [facility] ENDOSCOPY;  Service: Gastroenterology;  Laterality: N/A;  . TUBAL LIGATION      Past Medical Hx:  Past Medical History:  Diagnosis Date  . Osteoporosis   . Rhinitis     Past Gynecological History:  P1, SVD No LMP recorded. Patient is postmenopausal.  Family Hx:  Family History  Problem Relation Age of Onset  . Stomach cancer Mother   . Leukemia Father   . ALS Sister   . Colon cancer Maternal Aunt   . Colon cancer Maternal Aunt   . Leukemia Cousin   . Breast cancer Neg Hx   . Prostate cancer Neg Hx   . Bladder Cancer Neg Hx   . Kidney cancer Neg Hx     Review of Systems:  Constitutional  Feels well,    ENT Normal appearing ears and nares bilaterally Skin/Breast  No rash, sores, jaundice, itching, dryness Cardiovascular  No chest pain, shortness of breath, or edema  Pulmonary  No cough or wheeze.  Gastro Intestinal  No nausea, vomitting, or diarrhoea. No bright red blood per rectum, no abdominal pain, change in bowel movement, or constipation. + low pelvic swelling and pressure Genito Urinary  + frequency, no hematuria, no bleeding Musculo Skeletal  No myalgia, arthralgia, joint swelling or pain  Neurologic  No weakness, numbness, change in gait,  Psychology  No depression, anxiety, insomnia.   Vitals:  Blood pressure (!) 158/88, pulse 80, temperature (!)  97.3 F (36.3 C), temperature source Oral, resp. rate 20, height 5\' 2"  (1.575 m), weight 113 lb (51.3 kg), SpO2 100 %.  Physical Exam: WD in NAD Neck  Supple NROM, without any enlargements.  Lymph Node Survey No cervical supraclavicular or inguinal adenopathy Cardiovascular  Pulse normal rate, regularity and rhythm. S1 and S2 normal.  Lungs  Clear to auscultation bilateraly, without wheezes/crackles/rhonchi. Good air movement.  Skin  No rash/lesions/breakdown  Psychiatry  Alert and oriented to person, place, and time  Abdomen  Normoactive bowel sounds, abdomen soft, non-tender and thin without evidence of hernia. Suprapubic cystic swelling (nonmobile). Back No CVA tenderness Genito Urinary  Vulva/vagina: Normal external female genitalia.   No lesions. No discharge or bleeding.  Bladder/urethra:  No lesions or masses, well supported bladder  Vagina: normal  Cervix: Normal appearing, no lesions.  Uterus: Tilted anteriorally. Small, mobile, no parametrial involvement or nodularity.  Adnexa: Smooth cystic nonmobile mass in posterior cul de sac. Rectal  Good tone, no masses no cul de sac nodularity.  Extremities  No bilateral cyanosis, clubbing or edema.   Thereasa Solo, MD  02/23/2019, 11:40 AM

## 2019-02-24 LAB — CA 125: Cancer Antigen (CA) 125: 168 U/mL — ABNORMAL HIGH (ref 0.0–38.1)

## 2019-02-27 ENCOUNTER — Other Ambulatory Visit: Payer: Self-pay | Admitting: Gynecologic Oncology

## 2019-02-27 ENCOUNTER — Telehealth: Payer: Self-pay | Admitting: Gynecologic Oncology

## 2019-02-27 DIAGNOSIS — R1900 Intra-abdominal and pelvic swelling, mass and lump, unspecified site: Secondary | ICD-10-CM

## 2019-02-27 LAB — CEA (IN HOUSE-CHCC): CEA (CHCC-In House): 1.37 ng/mL (ref 0.00–5.00)

## 2019-02-27 NOTE — Telephone Encounter (Signed)
Informed patient of CA 125 and CEA results.  Advised that since the CA 125 is elevated, we would proceed with scheduling her for the hysterectomy along with BSO and possible staging based on frozen results.  No concerns voiced. Advised to call for any needs or concerns.

## 2019-02-27 NOTE — Telephone Encounter (Signed)
Attempted to call patient with tumor marker results.  Left message asking her to please call the office back at her earliest convenience to discuss.  Given the elevation in CA 125, we will proceed with a hysterectomy as part of her surgical procedure.

## 2019-02-27 NOTE — Progress Notes (Signed)
Need orders for 6-11 surgery in epic.

## 2019-03-05 NOTE — Progress Notes (Signed)
02/23/2019- noted in [facility]- CA 125 and CEA

## 2019-03-05 NOTE — Patient Instructions (Addendum)
Paula Acosta    Your procedure is scheduled on: Thursday 03/15/2019   Report to [facility] Main  Entrance             Report to admitting at   1100 AM                [place] [facility] TEST ON 03-12-2019 @_______ , THIS TEST MUST BE DONE BEFORE SURGERY, COME TO [place]  .    Call this number if you have problems the morning of surgery [phone]    Eat a light diet the day before surgery.  Examples including soups, broths, toast, yogurt, mashed potatoes.  Things to avoid include carbonated beverages (fizzy beverages), raw fruits and raw vegetables, or beans.   If your bowels are filled with gas, your surgeon will have difficulty visualizing your pelvic organs which increases your surgical risks.  _____________________________________________________________________    Remember: Do not eat food  :After Midnight. May have clear liquids from midnight up until 0700 am then nothing until after surgery.     CLEAR LIQUID DIET   Foods Allowed                                                                     Foods Excluded  Coffee and tea, regular and decaf                             liquids that you cannot  Plain Jell-O in any flavor                                             see through such as: Fruit ices (not with fruit pulp)                                     milk, soups, orange juice  Iced Popsicles                                    All solid food                                   Cranberry, grape and apple juices Sports drinks like Gatorade Lightly seasoned clear broth or consume(fat free) Sugar, honey syrup  Sample Menu Breakfast                               Cranberry juice                     Jell-O  Coffee or tea                                                                           _____________________________________________________________________               BRUSH YOUR TEETH MORNING OF SURGERY AND RINSE YOUR MOUTH OUT, NO CHEWING GUM CANDY OR MINTS.     Take these medicines the morning of surgery with A SIP OF WATER: none                                  You may not have any metal on your body including hair pins and              piercings                         Do not wear jewelry, make-up, lotions, powders or perfumes, deodorant              Do not wear nail polish.  Do not shave  48 hours prior to surgery.              Do not bring valuables to the hospital. [place].  Contacts, dentures or bridgework may not be worn into surgery.                   Please read over the following fact sheets you were given: _____________________________________________________________________             [facility] - Preparing for Surgery Before surgery, you can play an important role.   Because skin is not sterile, your skin needs to be as free of germs as possible .  You can reduce the number of germs on your skin by washing with CHG (chlorahexidine gluconate) soap before surgery.  CHG is an antiseptic cleaner which kills germs and bonds with the skin to continue killing germs even after washing. Please DO NOT use if you have an allergy to CHG or antibacterial soaps.  If your skin becomes reddened/irritated stop using the CHG and inform your nurse when you arrive at Short Stay. Do not shave (including legs and underarms) for at least 48 hours prior to the first CHG shower.  You may shave your face/neck.  Please follow these instructions carefully :  1.  Shower with CHG Soap the night before surgery and the  morning of Surgery.  2.  If you choose to wash your hair, wash your hair first as usual with your  normal  shampoo.  3.  After you shampoo, rinse your hair and body thoroughly to remove the  shampoo.                                          4.  Use CHG as you would any other liquid soap.  You can apply chg directly  to the skin and wash  Gently with a scrungie or clean washcloth.  5.  Apply the CHG Soap to your body ONLY FROM THE NECK DOWN.   Do not use on face/ open                           Wound or open sores. Avoid contact with eyes, ears mouth and genitals (private parts).                       Wash face,  Genitals (private parts) with your normal soap.             6.  Wash thoroughly, paying special attention to the area where your surgery  will be performed.  7.  Thoroughly rinse your body with warm water from the neck down.  8.  DO NOT shower/wash with your normal soap after using and rinsing off  the CHG Soap.             9.  Pat yourself dry with a clean towel.            10.  Wear clean pajamas.            11.  Place clean sheets on your bed the night of your first shower and do not  sleep with pets. Day of Surgery : Do not apply any lotions/deodorants the morning of surgery.  Please wear clean clothes to the hospital/surgery center.  FAILURE TO FOLLOW THESE INSTRUCTIONS MAY RESULT IN THE CANCELLATION OF YOUR SURGERY PATIENT SIGNATURE_________________________________  NURSE SIGNATURE__________________________________  ________________________________________________________________________   [name]  An incentive spirometer is a tool that can help keep your lungs clear and active. This tool measures how well you are filling your lungs with each breath. Taking long deep breaths may help reverse or decrease the chance of developing breathing (pulmonary) problems (especially infection) following:  A long period of time when you are unable to move or be active. BEFORE THE PROCEDURE   If the spirometer includes an indicator to show your best effort, your nurse or respiratory therapist will set it to a desired goal.  If possible, sit up  straight or lean slightly forward. Try not to slouch.  Hold the incentive spirometer in an upright position. INSTRUCTIONS FOR USE  1. Sit on the edge of your bed if possible, or sit up as far as you can in bed or on a chair. 2. Hold the incentive spirometer in an upright position. 3. Breathe out normally. 4. Place the mouthpiece in your mouth and seal your lips tightly around it. 5. Breathe in slowly and as deeply as possible, raising the piston or the ball toward the top of the column. 6. Hold your breath for 3-5 seconds or for as long as possible. Allow the piston or ball to fall to the bottom of the column. 7. Remove the mouthpiece from your mouth and breathe out normally. 8. Rest for a few seconds and repeat Steps 1 through 7 at least 10 times every 1-2 hours when you are awake. Take your time and take a few normal breaths between deep breaths. 9. The spirometer may include an indicator to show your best effort. Use the indicator as a goal to work toward during each repetition. 10. After each set of 10 deep breaths, practice coughing to be sure your lungs are clear. If you have an incision (the cut made at the time of surgery), support your incision  when coughing by placing a pillow or rolled up towels firmly against it. Once you are able to get out of bed, walk around indoors and cough well. You may stop using the incentive spirometer when instructed by your caregiver.  RISKS AND COMPLICATIONS  Take your time so you do not get dizzy or light-headed.  If you are in pain, you may need to take or ask for pain medication before doing incentive spirometry. It is harder to take a deep breath if you are having pain. AFTER USE  Rest and breathe slowly and easily.  It can be helpful to keep track of a log of your progress. Your caregiver can provide you with a simple table to help with this. If you are using the spirometer at home, follow these instructions: [place] IF:   You are  having difficultly using the spirometer.  You have trouble using the spirometer as often as instructed.  Your pain medication is not giving enough relief while using the spirometer.  You develop fever of 100.5 F (38.1 C) or higher. SEEK IMMEDIATE MEDICAL CARE IF:   You cough up bloody sputum that had not been present before.  You develop fever of 102 F (38.9 C) or greater.  You develop worsening pain at or near the incision site. MAKE SURE YOU:   Understand these instructions.  Will watch your condition.  Will get help right away if you are not doing well or get worse. Document Released: 01/31/2007 Document Revised: 12/13/2011 Document Reviewed: 04/03/2007 Exit[employer] Patient Information 2014 [employer], [place].   ________________________________________________________________________  WHAT IS A BLOOD TRANSFUSION? Blood Transfusion Information  A transfusion is the replacement of blood or some of its parts. Blood is made up of multiple cells which provide different functions.  Red blood cells carry oxygen and are used for blood loss replacement.  Beedy blood cells fight against infection.  Platelets control bleeding.  Plasma helps clot blood.  Other blood products are available for specialized needs, such as hemophilia or other clotting disorders. BEFORE THE TRANSFUSION  Who gives blood for transfusions?   Healthy volunteers who are fully evaluated to make sure their blood is safe. This is blood bank blood. Transfusion therapy is the safest it has ever been in the practice of medicine. Before blood is taken from a donor, a complete history is taken to make sure that person has no history of diseases nor engages in risky social behavior (examples are intravenous drug use or sexual activity with multiple partners). The donor's travel history is screened to minimize risk of transmitting infections, such as malaria. The donated blood is tested for signs of infectious diseases,  such as HIV and hepatitis. The blood is then tested to be sure it is compatible with you in order to minimize the chance of a transfusion reaction. If you or a relative donates blood, this is often done in anticipation of surgery and is not appropriate for emergency situations. It takes many days to process the donated blood. RISKS AND COMPLICATIONS Although transfusion therapy is very safe and saves many lives, the main dangers of transfusion include:   Getting an infectious disease.  Developing a transfusion reaction. This is an allergic reaction to something in the blood you were given. Every precaution is taken to prevent this. The decision to have a blood transfusion has been considered carefully by your caregiver before blood is given. Blood is not given unless the benefits outweigh the risks. AFTER THE TRANSFUSION  Right after  receiving a blood transfusion, you will usually feel much better and more energetic. This is especially true if your red blood cells have gotten low (anemic). The transfusion raises the level of the red blood cells which carry oxygen, and this usually causes an energy increase.  The nurse administering the transfusion will monitor you carefully for complications. HOME CARE INSTRUCTIONS  No special instructions are needed after a transfusion. You may find your energy is better. Speak with your caregiver about any limitations on activity for underlying diseases you may have. SEEK MEDICAL CARE IF:   Your condition is not improving after your transfusion.  You develop redness or irritation at the intravenous (IV) site. SEEK IMMEDIATE MEDICAL CARE IF:  Any of the following symptoms occur over the next 12 hours:  Shaking chills.  You have a temperature by mouth above 102 F (38.9 C), not controlled by medicine.  Chest, back, or muscle pain.  People around you feel you are not acting correctly or are confused.  Shortness of breath or difficulty  breathing.  Dizziness and fainting.  You get a rash or develop hives.  You have a decrease in urine output.  Your urine turns a dark color or changes to pink, red, or brown. Any of the following symptoms occur over the next 10 days:  You have a temperature by mouth above 102 F (38.9 C), not controlled by medicine.  Shortness of breath.  Weakness after normal activity.  The Behnke part of the eye turns yellow (jaundice).  You have a decrease in the amount of urine or are urinating less often.  Your urine turns a dark color or changes to pink, red, or brown. Document Released: 09/17/2000 Document Revised: 12/13/2011 Document Reviewed: 05/06/2008 [facility] Patient Information 2014 [place], [place].  _______________________________________________________________________

## 2019-03-06 ENCOUNTER — Encounter (HOSPITAL_COMMUNITY): Payer: Self-pay

## 2019-03-06 ENCOUNTER — Encounter (HOSPITAL_COMMUNITY)
Admission: RE | Admit: 2019-03-06 | Discharge: 2019-03-06 | Disposition: A | Discharge status: Home / Self Care | Payer: BC Managed Care – PPO | Source: Ambulatory Visit | Attending: Gynecologic Oncology | Admitting: Gynecologic Oncology

## 2019-03-06 ENCOUNTER — Other Ambulatory Visit: Payer: Self-pay

## 2019-03-06 DIAGNOSIS — Z01812 Encounter for preprocedural laboratory examination: Secondary | ICD-10-CM | POA: Insufficient documentation

## 2019-03-06 DIAGNOSIS — R1900 Intra-abdominal and pelvic swelling, mass and lump, unspecified site: Secondary | ICD-10-CM | POA: Insufficient documentation

## 2019-03-06 LAB — URINALYSIS, ROUTINE W REFLEX MICROSCOPIC
Bilirubin Urine: NEGATIVE
Glucose, UA: NEGATIVE mg/dL
Hgb urine dipstick: NEGATIVE
Ketones, ur: NEGATIVE mg/dL
Leukocytes,Ua: NEGATIVE
Nitrite: NEGATIVE
Protein, ur: NEGATIVE mg/dL
Specific Gravity, Urine: 1.004 — ABNORMAL LOW (ref 1.005–1.030)
pH: 6 (ref 5.0–8.0)

## 2019-03-06 LAB — COMPREHENSIVE METABOLIC PANEL
ALT: 23 U/L (ref 0–44)
AST: 23 U/L (ref 15–41)
Albumin: 4.6 g/dL (ref 3.5–5.0)
Alkaline Phosphatase: 69 U/L (ref 38–126)
Anion gap: 8 (ref 5–15)
BUN: 18 mg/dL (ref 8–23)
CO2: 27 mmol/L (ref 22–32)
Calcium: 9.4 mg/dL (ref 8.9–10.3)
Chloride: 106 mmol/L (ref 98–111)
Creatinine, Ser: 0.93 mg/dL (ref 0.44–1.00)
GFR calc Af Amer: >60 mL/min (ref >60)
GFR calc non Af Amer: >60 mL/min (ref >60)
Glucose, Bld: 102 mg/dL — ABNORMAL HIGH (ref 70–99)
Potassium: 3.9 mmol/L (ref 3.5–5.1)
Sodium: 141 mmol/L (ref 135–145)
Total Bilirubin: 0.3 mg/dL (ref 0.3–1.2)
Total Protein: 7.4 g/dL (ref 6.5–8.1)

## 2019-03-06 LAB — CBC
HCT: 42 % (ref 36.0–46.0)
Hemoglobin: 14.2 g/dL (ref 12.0–15.0)
MCH: 34 pg (ref 26.0–34.0)
MCHC: 33.8 g/dL (ref 30.0–36.0)
MCV: 100.5 fL — ABNORMAL HIGH (ref 80.0–100.0)
Platelets: 255 10*3/uL (ref 150–400)
RBC: 4.18 MIL/uL (ref 3.87–5.11)
RDW: 11.8 % (ref 11.5–15.5)
WBC: 8.2 10*3/uL (ref 4.0–10.5)
nRBC: 0 % (ref 0.0–0.2)

## 2019-03-07 LAB — ABO/RH: ABO/RH(D): B POS

## 2019-03-12 ENCOUNTER — Other Ambulatory Visit (HOSPITAL_COMMUNITY)
Admission: RE | Admit: 2019-03-12 | Discharge: 2019-03-12 | Disposition: A | Discharge status: Home / Self Care | Payer: BC Managed Care – PPO | Source: Ambulatory Visit | Attending: Gynecologic Oncology | Admitting: Gynecologic Oncology

## 2019-03-12 ENCOUNTER — Other Ambulatory Visit: Payer: Self-pay

## 2019-03-12 DIAGNOSIS — Z1159 Encounter for screening for other viral diseases: Secondary | ICD-10-CM | POA: Diagnosis not present

## 2019-03-13 LAB — NOVEL CORONAVIRUS, NAA (HOSP ORDER, SEND-OUT TO REF LAB; TAT 18-24 HRS): SARS-CoV-2, NAA: NOT DETECTED

## 2019-03-14 NOTE — Progress Notes (Signed)
SPOKE W/  _patient     SCREENING SYMPTOMS OF COVID 19: completed   COUGH--no  RUNNY NOSE---no   SORE THROAT---no  NASAL CONGESTION---- no SNEEZING----no  SHORTNESS OF BREATH---no  DIFFICULTY BREATHING---no  TEMP >100.0 ---no--  UNEXPLAINED BODY ACHES-----no-  CHILLS ----no----   HEADACHES ------no---  LOSS OF SMELL/ TASTE -----no---    HAVE YOU OR ANY FAMILY MEMBER TRAVELLED PAST 14 DAYS OUT OF THE   COUNTY-no-- STATE--no-- COUNTRY---no-  HAVE YOU OR ANY FAMILY MEMBER BEEN EXPOSED TO ANYONE WITH COVID 19? no

## 2019-03-15 ENCOUNTER — Other Ambulatory Visit: Payer: Self-pay

## 2019-03-15 ENCOUNTER — Ambulatory Visit (HOSPITAL_COMMUNITY): Payer: BC Managed Care – PPO | Admitting: Certified Registered"

## 2019-03-15 ENCOUNTER — Ambulatory Visit (HOSPITAL_COMMUNITY): Payer: BC Managed Care – PPO | Admitting: Physician Assistant

## 2019-03-15 ENCOUNTER — Encounter (HOSPITAL_COMMUNITY): Payer: Self-pay

## 2019-03-15 ENCOUNTER — Encounter (HOSPITAL_COMMUNITY)
Admission: AD | Disposition: A | Discharge status: Home / Self Care | Payer: Self-pay | Source: Home / Self Care | Attending: Gynecologic Oncology

## 2019-03-15 ENCOUNTER — Inpatient Hospital Stay (HOSPITAL_COMMUNITY)
Admission: AD | Admit: 2019-03-15 | Discharge: 2019-03-17 | DRG: 743 | Disposition: A | Discharge status: Home / Self Care | Payer: BC Managed Care – PPO | Attending: Gynecologic Oncology | Admitting: Gynecologic Oncology

## 2019-03-15 DIAGNOSIS — Z800 Family history of malignant neoplasm of digestive organs: Secondary | ICD-10-CM | POA: Diagnosis not present

## 2019-03-15 DIAGNOSIS — K589 Irritable bowel syndrome without diarrhea: Secondary | ICD-10-CM | POA: Diagnosis present

## 2019-03-15 DIAGNOSIS — I10 Essential (primary) hypertension: Secondary | ICD-10-CM | POA: Diagnosis present

## 2019-03-15 DIAGNOSIS — R1900 Intra-abdominal and pelvic swelling, mass and lump, unspecified site: Secondary | ICD-10-CM | POA: Diagnosis present

## 2019-03-15 DIAGNOSIS — D271 Benign neoplasm of left ovary: Principal | ICD-10-CM | POA: Diagnosis present

## 2019-03-15 DIAGNOSIS — Z806 Family history of leukemia: Secondary | ICD-10-CM

## 2019-03-15 HISTORY — PX: OMENTECTOMY: SHX5985

## 2019-03-15 HISTORY — PX: LAPAROTOMY: SHX154

## 2019-03-15 LAB — TYPE AND SCREEN
ABO/RH(D): B POS
Antibody Screen: NEGATIVE

## 2019-03-15 SURGERY — LAPAROTOMY, EXPLORATORY
Anesthesia: General | Laterality: Bilateral

## 2019-03-15 MED ORDER — IBUPROFEN 200 MG PO TABS: 600.0000 mg | ORAL_TABLET | Freq: Four times a day (QID) | ORAL | Status: DC | PRN

## 2019-03-15 MED ORDER — DEXAMETHASONE SODIUM PHOSPHATE 10 MG/ML IJ SOLN
INTRAMUSCULAR | Status: AC
  Filled 2019-03-15: qty 1

## 2019-03-15 MED ORDER — HYDROMORPHONE HCL 1 MG/ML IJ SOLN
INTRAMUSCULAR | Status: AC
  Administered 2019-03-15: 16:00:00 0.25 mg via INTRAVENOUS
  Filled 2019-03-15: qty 2

## 2019-03-15 MED ORDER — LOSARTAN POTASSIUM 50 MG PO TABS
50.0000 mg | ORAL_TABLET | Freq: Every day | ORAL | Status: DC
  Administered 2019-03-16 – 2019-03-17 (×2): 50 mg via ORAL
  Filled 2019-03-15 (×2): qty 1

## 2019-03-15 MED ORDER — MEPERIDINE HCL 50 MG/ML IJ SOLN: 6.2500 mg | INTRAMUSCULAR | Status: DC | PRN

## 2019-03-15 MED ORDER — DEXAMETHASONE SODIUM PHOSPHATE 4 MG/ML IJ SOLN: 4.0000 mg | INTRAMUSCULAR | Status: DC

## 2019-03-15 MED ORDER — PROPOFOL 10 MG/ML IV BOLUS
INTRAVENOUS | Status: AC
  Filled 2019-03-15: qty 20

## 2019-03-15 MED ORDER — DEXAMETHASONE SODIUM PHOSPHATE 10 MG/ML IJ SOLN
INTRAMUSCULAR | Status: DC | PRN
  Administered 2019-03-15: 5 mg via INTRAVENOUS

## 2019-03-15 MED ORDER — LIDOCAINE 2% (20 MG/ML) 5 ML SYRINGE
INTRAMUSCULAR | Status: DC | PRN
  Administered 2019-03-15: 60 mg via INTRAVENOUS

## 2019-03-15 MED ORDER — FENTANYL CITRATE (PF) 100 MCG/2ML IJ SOLN
INTRAMUSCULAR | Status: AC
  Filled 2019-03-15: qty 2

## 2019-03-15 MED ORDER — ROCURONIUM BROMIDE 10 MG/ML (PF) SYRINGE
PREFILLED_SYRINGE | INTRAVENOUS | Status: DC | PRN
  Administered 2019-03-15: 60 mg via INTRAVENOUS
  Administered 2019-03-15: 20 mg via INTRAVENOUS

## 2019-03-15 MED ORDER — BUPIVACAINE LIPOSOME 1.3 % IJ SUSP
20.0000 mL | Freq: Once | INTRAMUSCULAR | Status: AC
  Administered 2019-03-15: 20 mL
  Filled 2019-03-15: qty 20

## 2019-03-15 MED ORDER — PROMETHAZINE HCL 25 MG/ML IJ SOLN: 6.2500 mg | INTRAMUSCULAR | Status: DC | PRN

## 2019-03-15 MED ORDER — BUPIVACAINE HCL (PF) 0.25 % IJ SOLN
INTRAMUSCULAR | Status: AC
  Filled 2019-03-15: qty 30

## 2019-03-15 MED ORDER — ONDANSETRON HCL 4 MG/2ML IJ SOLN
INTRAMUSCULAR | Status: AC
  Filled 2019-03-15: qty 2

## 2019-03-15 MED ORDER — GABAPENTIN 300 MG PO CAPS
300.0000 mg | ORAL_CAPSULE | ORAL | Status: AC
  Administered 2019-03-15: 300 mg via ORAL
  Filled 2019-03-15: qty 1

## 2019-03-15 MED ORDER — TRAMADOL HCL 50 MG PO TABS
100.0000 mg | ORAL_TABLET | Freq: Two times a day (BID) | ORAL | Status: DC | PRN
  Administered 2019-03-16: 100 mg via ORAL
  Filled 2019-03-15: qty 2

## 2019-03-15 MED ORDER — OXYCODONE HCL 5 MG PO TABS
5.0000 mg | ORAL_TABLET | ORAL | Status: DC | PRN
  Administered 2019-03-15 – 2019-03-17 (×5): 5 mg via ORAL
  Filled 2019-03-15 (×5): qty 1

## 2019-03-15 MED ORDER — CEFAZOLIN SODIUM-DEXTROSE 2-4 GM/100ML-% IV SOLN
2.0000 g | INTRAVENOUS | Status: AC
  Administered 2019-03-15: 2 g via INTRAVENOUS
  Filled 2019-03-15: qty 100

## 2019-03-15 MED ORDER — HYDROMORPHONE HCL 1 MG/ML IJ SOLN
0.2500 mg | INTRAMUSCULAR | Status: DC | PRN
  Administered 2019-03-15 (×2): 0.25 mg via INTRAVENOUS
  Administered 2019-03-15 (×3): 0.5 mg via INTRAVENOUS

## 2019-03-15 MED ORDER — PREGABALIN 25 MG PO CAPS
25.0000 mg | ORAL_CAPSULE | Freq: Two times a day (BID) | ORAL | Status: DC
  Administered 2019-03-16 – 2019-03-17 (×3): 25 mg via ORAL
  Filled 2019-03-15 (×3): qty 1

## 2019-03-15 MED ORDER — FENTANYL CITRATE (PF) 250 MCG/5ML IJ SOLN
INTRAMUSCULAR | Status: AC
  Filled 2019-03-15: qty 5

## 2019-03-15 MED ORDER — 0.9 % SODIUM CHLORIDE (POUR BTL) OPTIME
TOPICAL | Status: DC | PRN
  Administered 2019-03-15: 2000 mL

## 2019-03-15 MED ORDER — METHYLENE BLUE 0.5 % INJ SOLN
INTRAVENOUS | Status: AC
  Filled 2019-03-15: qty 10

## 2019-03-15 MED ORDER — PHENYLEPHRINE 40 MCG/ML (10ML) SYRINGE FOR IV PUSH (FOR BLOOD PRESSURE SUPPORT)
PREFILLED_SYRINGE | INTRAVENOUS | Status: AC
  Filled 2019-03-15: qty 10

## 2019-03-15 MED ORDER — SENNOSIDES-DOCUSATE SODIUM 8.6-50 MG PO TABS
2.0000 | ORAL_TABLET | Freq: Every day | ORAL | Status: DC
  Administered 2019-03-15 – 2019-03-16 (×2): 2 via ORAL
  Filled 2019-03-15 (×2): qty 2

## 2019-03-15 MED ORDER — SUGAMMADEX SODIUM 200 MG/2ML IV SOLN
INTRAVENOUS | Status: AC
  Filled 2019-03-15: qty 2

## 2019-03-15 MED ORDER — ENSURE ENLIVE PO LIQD
237.0000 mL | Freq: Two times a day (BID) | ORAL | Status: DC
  Administered 2019-03-15 – 2019-03-17 (×4): 237 mL via ORAL

## 2019-03-15 MED ORDER — ONDANSETRON HCL 4 MG/2ML IJ SOLN
4.0000 mg | Freq: Four times a day (QID) | INTRAMUSCULAR | Status: DC | PRN
  Administered 2019-03-17: 09:00:00 4 mg via INTRAVENOUS
  Filled 2019-03-15: qty 2

## 2019-03-15 MED ORDER — SODIUM CHLORIDE (PF) 0.9 % IJ SOLN
INTRAMUSCULAR | Status: AC
  Filled 2019-03-15: qty 10

## 2019-03-15 MED ORDER — MIDAZOLAM HCL 2 MG/2ML IJ SOLN: 0.5000 mg | Freq: Once | INTRAMUSCULAR | Status: DC | PRN

## 2019-03-15 MED ORDER — HEMOSTATIC AGENTS (NO CHARGE) OPTIME
TOPICAL | Status: DC | PRN
  Administered 2019-03-15: 1 via TOPICAL

## 2019-03-15 MED ORDER — ACETAMINOPHEN 500 MG PO TABS
1000.0000 mg | ORAL_TABLET | ORAL | Status: AC
  Administered 2019-03-15: 12:00:00 1000 mg via ORAL
  Filled 2019-03-15: qty 2

## 2019-03-15 MED ORDER — SCOPOLAMINE 1 MG/3DAYS TD PT72
1.0000 | MEDICATED_PATCH | TRANSDERMAL | Status: DC
  Administered 2019-03-15: 1.5 mg via TRANSDERMAL
  Filled 2019-03-15: qty 1

## 2019-03-15 MED ORDER — ACETAMINOPHEN 500 MG PO TABS
1000.0000 mg | ORAL_TABLET | Freq: Two times a day (BID) | ORAL | Status: DC
  Administered 2019-03-15 – 2019-03-17 (×4): 1000 mg via ORAL
  Filled 2019-03-15 (×4): qty 2

## 2019-03-15 MED ORDER — FENTANYL CITRATE (PF) 250 MCG/5ML IJ SOLN
INTRAMUSCULAR | Status: DC | PRN
  Administered 2019-03-15: 50 ug via INTRAVENOUS
  Administered 2019-03-15: 100 ug via INTRAVENOUS
  Administered 2019-03-15: 25 ug via INTRAVENOUS
  Administered 2019-03-15: 50 ug via INTRAVENOUS
  Administered 2019-03-15: 25 ug via INTRAVENOUS
  Administered 2019-03-15: 50 ug via INTRAVENOUS

## 2019-03-15 MED ORDER — LACTATED RINGERS IR SOLN
Status: DC | PRN
  Administered 2019-03-15: 1

## 2019-03-15 MED ORDER — ENOXAPARIN SODIUM 40 MG/0.4ML ~~LOC~~ SOLN
40.0000 mg | SUBCUTANEOUS | Status: DC
  Administered 2019-03-16 – 2019-03-17 (×2): 40 mg via SUBCUTANEOUS
  Filled 2019-03-15 (×2): qty 0.4

## 2019-03-15 MED ORDER — ROCURONIUM BROMIDE 10 MG/ML (PF) SYRINGE
PREFILLED_SYRINGE | INTRAVENOUS | Status: AC
  Filled 2019-03-15: qty 10

## 2019-03-15 MED ORDER — KCL IN DEXTROSE-NACL 20-5-0.45 MEQ/L-%-% IV SOLN
INTRAVENOUS | Status: DC
  Administered 2019-03-15 – 2019-03-17 (×5): via INTRAVENOUS
  Filled 2019-03-15 (×5): qty 1000

## 2019-03-15 MED ORDER — LACTATED RINGERS IV SOLN
INTRAVENOUS | Status: DC
  Administered 2019-03-15 (×2): via INTRAVENOUS

## 2019-03-15 MED ORDER — PROPOFOL 10 MG/ML IV BOLUS
INTRAVENOUS | Status: DC | PRN
  Administered 2019-03-15: 100 mg via INTRAVENOUS

## 2019-03-15 MED ORDER — METHYLENE BLUE 0.5 % INJ SOLN
INTRAVENOUS | Status: DC | PRN
  Administered 2019-03-15: 10 mL via INTRAVENOUS

## 2019-03-15 MED ORDER — ONDANSETRON HCL 4 MG/2ML IJ SOLN
INTRAMUSCULAR | Status: DC | PRN
  Administered 2019-03-15: 4 mg via INTRAVENOUS

## 2019-03-15 MED ORDER — LIDOCAINE 2% (20 MG/ML) 5 ML SYRINGE
INTRAMUSCULAR | Status: AC
  Filled 2019-03-15: qty 5

## 2019-03-15 MED ORDER — ENOXAPARIN SODIUM 40 MG/0.4ML ~~LOC~~ SOLN
40.0000 mg | SUBCUTANEOUS | Status: AC
  Administered 2019-03-15: 40 mg via SUBCUTANEOUS
  Filled 2019-03-15: qty 0.4

## 2019-03-15 MED ORDER — ONDANSETRON HCL 4 MG PO TABS: 4.0000 mg | ORAL_TABLET | Freq: Four times a day (QID) | ORAL | Status: DC | PRN

## 2019-03-15 MED ORDER — EPHEDRINE SULFATE-NACL 50-0.9 MG/10ML-% IV SOSY
PREFILLED_SYRINGE | INTRAVENOUS | Status: DC | PRN
  Administered 2019-03-15: 10 mg via INTRAVENOUS

## 2019-03-15 MED ORDER — CHEWING GUM (ORBIT) SUGAR FREE
1.0000 | CHEWING_GUM | Freq: Three times a day (TID) | ORAL | Status: DC
  Administered 2019-03-16 – 2019-03-17 (×5): 1 via ORAL
  Filled 2019-03-15: qty 1

## 2019-03-15 MED ORDER — BUPIVACAINE HCL 0.25 % IJ SOLN
INTRAMUSCULAR | Status: DC | PRN
  Administered 2019-03-15: 20 mL

## 2019-03-15 MED ORDER — SUGAMMADEX SODIUM 200 MG/2ML IV SOLN
INTRAVENOUS | Status: DC | PRN
  Administered 2019-03-15: 150 mg via INTRAVENOUS

## 2019-03-15 MED ORDER — HYDROMORPHONE HCL 1 MG/ML IJ SOLN
0.5000 mg | INTRAMUSCULAR | Status: DC | PRN
  Administered 2019-03-15 – 2019-03-16 (×3): 0.5 mg via INTRAVENOUS
  Filled 2019-03-15 (×3): qty 0.5

## 2019-03-15 SURGICAL SUPPLY — 67 items
APPLICATOR SURGIFLO ENDO (HEMOSTASIS) IMPLANT
BAG LAPAROSCOPIC 12 15 PORT 16 (BASKET) IMPLANT
BAG RETRIEVAL 12/15 (BASKET)
CLIP VESOCCLUDE MED 6/CT (CLIP) ×3 IMPLANT
CONT SPEC 4OZ CLIKSEAL STRL BL (MISCELLANEOUS) ×3 IMPLANT
COVER BACK TABLE 60X90IN (DRAPES) ×3 IMPLANT
COVER TIP SHEARS 8 DVNC (MISCELLANEOUS) ×2 IMPLANT
COVER TIP SHEARS 8MM DA VINCI (MISCELLANEOUS) ×1
COVER WAND RF STERILE (DRAPES) IMPLANT
DECANTER SPIKE VIAL GLASS SM (MISCELLANEOUS) IMPLANT
DERMABOND ADVANCED (GAUZE/BANDAGES/DRESSINGS) ×1
DERMABOND ADVANCED .7 DNX12 (GAUZE/BANDAGES/DRESSINGS) ×2 IMPLANT
DRAIN CHANNEL RND F F (WOUND CARE) IMPLANT
DRAPE ARM DVNC X/XI (DISPOSABLE) ×8 IMPLANT
DRAPE COLUMN DVNC XI (DISPOSABLE) ×2 IMPLANT
DRAPE DA VINCI XI ARM (DISPOSABLE) ×4
DRAPE DA VINCI XI COLUMN (DISPOSABLE) ×1
DRAPE SHEET LG 3/4 BI-LAMINATE (DRAPES) ×3 IMPLANT
DRAPE SURG IRRIG POUCH 19X23 (DRAPES) ×3 IMPLANT
DRSG OPSITE POSTOP 4X10 (GAUZE/BANDAGES/DRESSINGS) ×3 IMPLANT
ELECT REM PT RETURN 15FT ADLT (MISCELLANEOUS) ×3 IMPLANT
GAUZE 4X4 16PLY RFD (DISPOSABLE) IMPLANT
GLOVE BIO SURGEON STRL SZ 6 (GLOVE) ×12 IMPLANT
GLOVE BIO SURGEON STRL SZ 6.5 (GLOVE) IMPLANT
GOWN STRL REUS W/ TWL LRG LVL3 (GOWN DISPOSABLE) ×4 IMPLANT
GOWN STRL REUS W/TWL LRG LVL3 (GOWN DISPOSABLE) ×2
HEMOSTAT ARISTA ABSORB 3G PWDR (HEMOSTASIS) ×3 IMPLANT
HOLDER FOLEY CATH W/STRAP (MISCELLANEOUS) ×3 IMPLANT
IRRIG SUCT STRYKERFLOW 2 WTIP (MISCELLANEOUS) ×3
IRRIGATION SUCT STRKRFLW 2 WTP (MISCELLANEOUS) ×2 IMPLANT
KIT PROCEDURE DA VINCI SI (MISCELLANEOUS)
KIT PROCEDURE DVNC SI (MISCELLANEOUS) IMPLANT
KIT TURNOVER KIT A (KITS) IMPLANT
LIGASURE IMPACT 36 18CM CVD LR (INSTRUMENTS) ×3 IMPLANT
MANIPULATOR UTERINE 4.5 ZUMI (MISCELLANEOUS) ×3 IMPLANT
NEEDLE HYPO 22GX1.5 SAFETY (NEEDLE) IMPLANT
NEEDLE SPNL 18GX3.5 QUINCKE PK (NEEDLE) IMPLANT
OBTURATOR OPTICAL STANDARD 8MM (TROCAR) ×1
OBTURATOR OPTICAL STND 8 DVNC (TROCAR) ×2
OBTURATOR OPTICALSTD 8 DVNC (TROCAR) ×2 IMPLANT
PACK ROBOT GYN CUSTOM WL (TRAY / TRAY PROCEDURE) ×3 IMPLANT
PAD POSITIONING PINK XL (MISCELLANEOUS) ×3 IMPLANT
PORT ACCESS TROCAR AIRSEAL 12 (TROCAR) ×2 IMPLANT
PORT ACCESS TROCAR AIRSEAL 5M (TROCAR) ×1
POUCH SPECIMEN RETRIEVAL 10MM (ENDOMECHANICALS) IMPLANT
SEAL CANN UNIV 5-8 DVNC XI (MISCELLANEOUS) ×6 IMPLANT
SEAL XI 5MM-8MM UNIVERSAL (MISCELLANEOUS) ×3
SET TRI-LUMEN FLTR TB AIRSEAL (TUBING) ×3 IMPLANT
SPONGE LAP 18X18 X RAY DECT (DISPOSABLE) ×3 IMPLANT
SURGIFLO W/THROMBIN 8M KIT (HEMOSTASIS) IMPLANT
SUT VIC AB 0 CT1 27 (SUTURE)
SUT VIC AB 0 CT1 27XBRD ANTBC (SUTURE) IMPLANT
SUT VIC AB 0 CT1 36 (SUTURE) ×24 IMPLANT
SUT VIC AB 2-0 CT1 36 (SUTURE) ×6 IMPLANT
SUT VIC AB 2-0 CT2 27 (SUTURE) ×21 IMPLANT
SUT VIC AB 2-0 SH 27 (SUTURE) ×1
SUT VIC AB 2-0 SH 27X BRD (SUTURE) ×2 IMPLANT
SUT VIC AB 4-0 PS2 18 (SUTURE) ×6 IMPLANT
SYR 10ML LL (SYRINGE) IMPLANT
SYS LAPSCP GELPORT 120MM (MISCELLANEOUS) ×3
SYSTEM LAPSCP GELPORT 120MM (MISCELLANEOUS) ×2 IMPLANT
TOWEL OR NON WOVEN STRL DISP B (DISPOSABLE) ×3 IMPLANT
TRAP SPECIMEN MUCOUS 40CC (MISCELLANEOUS) IMPLANT
TRAY FOLEY MTR SLVR 16FR STAT (SET/KITS/TRAYS/PACK) ×3 IMPLANT
UNDERPAD 30X30 (UNDERPADS AND DIAPERS) ×3 IMPLANT
WATER STERILE IRR 1000ML POUR (IV SOLUTION) ×3 IMPLANT
YANKAUER SUCT BULB TIP NO VENT (SUCTIONS) ×3 IMPLANT

## 2019-03-15 NOTE — Anesthesia Procedure Notes (Signed)
Procedure Name: Intubation Date/Time: 03/15/2019 1:24 PM Performed by: Myna Bright, CRNA Pre-anesthesia Checklist: Patient identified, Emergency Drugs available, Suction available and Patient being monitored Patient Re-evaluated:Patient Re-evaluated prior to induction Oxygen Delivery Method: Circle system utilized Preoxygenation: Pre-oxygenation with 100% oxygen Induction Type: IV induction Ventilation: Mask ventilation without difficulty Laryngoscope Size: Mac and 3 Grade View: Grade I Tube type: Oral Tube size: 7.0 mm Number of attempts: 1 Airway Equipment and Method: Stylet Placement Confirmation: ETT inserted through vocal cords under direct vision,  positive ETCO2 and breath sounds checked- equal and bilateral Secured at: 21 cm Tube secured with: Tape Dental Injury: Teeth and Oropharynx as per pre-operative assessment

## 2019-03-15 NOTE — Op Note (Signed)
OPERATIVE NOTE 03/15/2019 Preoperative Diagnosis: left adnexal mass.  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   Procedure(s) Performed: Exploratory laparotomy with total abdominal hysterectomy, bilateral salpingo-oophorectomy, omentectomy.  Surgeon: Everitt Amber, MD. [place] Surgeon: Chrissie Noa- Laurance Flatten, M.D. (an MD assistant was necessary for tissue manipulation, retraction and positioning due to Paula complexity of Paula case and hospital policies).  Specimens: Washings, uterus, Bilateral tubes / ovaries, and omentum.  Estimated Blood Loss: 264mL. Blood Replacement: Acosta. IV Fluids: 2077mL.  Urine Output: 253GU  Complications: Acosta.   Operative Findings:17 cm mass that was emanating from Paula left ovary, growing retroperitoneally and displacing Paula sigmoid colon anteriorally. Nointraperitoneal rupture occurred; normal appearing very small uterus and right tube and ovary. Normal omentum and upper abdomen. Normal appendix. 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 She was prepped and draped in Paula normal sterile fashion in Paula dorsal lithotomy position in padded Allen stirrups with good attention paid to support of Paula lower back and lower extremities. Position was adjusted for appropriate support. A Foley catheter was placed to gravity.   A vertical midline incision was made from Paula pubic symphysis to Paula umbilicus. Paula abdominal cavity was entered sharply and without incident. A Bookwalter retractor was then placed. A survey of Paula abdomen and pelvis revealed Paula above findings, which were significant for a large 17cm left-sided retroperitoneal mass  Paula right tube and ovary were grossly normal in appearance. Paula large pelvic mass had displaced Paula  uterus anteriorally. Paula sigmoid colon was splayed over Paula mass. After packing Paula small bowel into Paula upper abdomen, we began Paula procedure by entering Paula left pelvic sidewall just posterior to Paula round ligament.  There was careful dissection to separate Paula sigmoid colon mesentery and bladder from Paula mass. Paula left IP ligament was isolated and Paula ureter was palpated deep to this but could not be seen due to Paula large mass. A ligasure was used to transect Paula IP ligament. Paula ovary was then carefully dissected from its attachments to Paula cul de sac where it was deeply adherent and Paula left ureter. Paula left ureter was visually in tact from Paula pelvic brim to Paula bladder.   Paula utero-ovarian ligament was skeletonized on Paula left, Paula left ovary was then transected using ligasure and sent for frozen section. While this was being processed we proceeded with Paula hysterectomy.   Paula right round ligament was opened Paula right ureter was identified in Paula retroperitoneal space.  A window was made above Paula right ureter which skeletonized Paula right ovarian vessels which were then sealed with Paula LigaSure and transected.  Paula posterior peritoneal attachments to Paula uterus were taken down with Paula Bovie.  Paula bladder flap was then completed using Paula Bovie and careful blunt dissection to ensure Paula bladder had been mobilized below Paula level of Paula cervicovaginal junction.  On Paula left side Paula anterior skeletonization of Paula vessels was completed.  Paula uterine vessels were clamped bilaterally at Paula uterine isthmus with curved Heaney clamps then Paula pedicles were transected and suture ligated. Successive passes of straight Heaney clamps were used down Paula cardinal ligaments bilaterally with each pass medial to Paula prior. Paula pedicles were sharply transected and made hemostatic with suture. Paula bladder was ensured to be below Paula cervicovaginal junction, then curved clamps were  passed across Paula cervicovaginal  junction and Paula uterus was sharply transected. Paula vaginal cuff was closed with 0-vicryl on interrupted figure of 8 suture. It was apparent that Paula right tube and ovary were grossly normal in appearance and were sent for permanent pathology.  Paula entire uterus and right tube and ovary were then sent to Pathology.  Presuming at least borderline tumor a staging procedure was started with omentectomy. Paula omentum was delivered into Paula incision and elevated. Paula parietal peritonum that attaches Paula omentum to Paula transverse colon was incised facilitating separation of Paula mid portion of Paula omentum from Paula transverse colon. Paula right sided omentum with its vascular pedicles was then skeletonized in its attachments to Paula right transverse colon. These vascular pedicles were bipolar sealed and transected. Observation of Paula colonic wall occurred throughout. Paula dissection then moved progressively along Paula colon Paula Paula left splenic flexure of Paula transverse colon. Paula ligasure and Paula scissors were used to skeletonize vascular omental pedicles, seal them and transect them until Paula entire infracolic omentum had been freed from its transverse colonic attachments to Paula splenic flexure. Paula omentum was sent for permanent pathology. Palpation of Paula upper abdomen, including Paula liver, spleen, stomach, and pancreas, was normal.  Paula abdomen and pelvis were then copiously irrigated and all surgical sites found to be hemostatic. Paula fascia was reapproximated with #1 looped PDS using a total of two sutures. Paula subcutaneous layer was then irrigated with a liter of fluid and Paula deep layer closed with interrupted 2-0 Vicryl. Paula subcutaneous layer was then reapproximated with a running 4-0 Monocryl. Paula patient tolerated Paula procedure well.    Sponge, lap and needle counts were correct x 3.    Paula patient had sequential compression devices for VTE prophylaxis and will she receive Lovenox postoperatively.   Paula  patient was extubated and taken to Paula recovery room in stable condition.   Thereasa Solo, MD

## 2019-03-15 NOTE — Interval H&P Note (Signed)
History and Physical Interval Note:  03/15/2019 12:37 PM  Paula Acosta  has presented today for surgery, with the diagnosis of PELVIC MASS.  The various methods of treatment have been discussed with the patient and family. After consideration of risks, benefits and other options for treatment, the patient has consented to  Procedure(s): XI ROBOTIC ASSISTED TOTAL HYSTERECTOMY WITH BILATERAL SALPINGO OOPHORECTOMY , MINI LAPAROTOMY AND POSSIBLE STAGING (Bilateral) as a surgical intervention.  The patient's history has been reviewed, patient examined, no change in status, stable for surgery.  I have reviewed the patient's chart and labs.  Questions were answered to the patient's satisfaction.     Thereasa Solo

## 2019-03-15 NOTE — Plan of Care (Signed)
Patient arrived via stretcher from PACU. Asleep but arousable. Pain well controlled currently. Will continue to monitor.

## 2019-03-15 NOTE — Transfer of Care (Signed)
Immediate Anesthesia Transfer of Care Note  Patient: Paula Acosta  Procedure(s) Performed: EXPLORATORY LAPAROTOMY, TOTAL ABDOMINAL HYSTERECTOMY. OMENTECTOMY  Patient Location: PACU  Anesthesia Type:General  Level of Consciousness: drowsy and patient cooperative  Airway & Oxygen Therapy: Patient Spontanous Breathing and Patient connected to face mask oxygen  Post-op Assessment: Report given to RN and Post -op Vital signs reviewed and stable  Post vital signs: Reviewed and stable  Last Vitals:  Vitals Value Taken Time  BP 147/78 03/15/19 1605  Temp    Pulse 58 03/15/19 1606  Resp 13 03/15/19 1606  SpO2 100 % 03/15/19 1606  Vitals shown include unvalidated device data.  Last Pain:  Vitals:   03/15/19 1136  TempSrc:   PainSc: 0-No pain         Complications: No apparent anesthesia complications

## 2019-03-15 NOTE — Anesthesia Preprocedure Evaluation (Signed)
Anesthesia Evaluation  Patient identified by MRN, date of birth, ID band Patient awake    Reviewed: Allergy & Precautions, NPO status , Patient's Chart, lab work & pertinent test results  History of Anesthesia Complications Negative for: history of anesthetic complications  Airway Mallampati: II  TM Distance: >3 FB Neck ROM: Full    Dental  (+) Dental Advisory Given   Pulmonary neg pulmonary ROS,    breath sounds clear to auscultation       Cardiovascular negative cardio ROS   Rhythm:Regular Rate:Normal     Neuro/Psych negative neurological ROS     GI/Hepatic negative GI ROS, Neg liver ROS,   Endo/Other  negative endocrine ROS  Renal/GU negative Renal ROS     Musculoskeletal   Abdominal   Peds  Hematology negative hematology ROS (+)   Anesthesia Other Findings   Reproductive/Obstetrics                             Anesthesia Physical Anesthesia Plan  ASA: II  Anesthesia Plan: General   Post-op Pain Management:    Induction: Intravenous  PONV Risk Score and Plan: 4 or greater and Scopolamine patch - Pre-op, Dexamethasone and Ondansetron  Airway Management Planned: Oral ETT  Additional Equipment:   Intra-op Plan:   Post-operative Plan: Extubation in OR  Informed Consent: I have reviewed the patients History and Physical, chart, labs and discussed the procedure including the risks, benefits and alternatives for the proposed anesthesia with the patient or authorized representative who has indicated his/her understanding and acceptance.     Dental advisory given  Plan Discussed with: CRNA and Surgeon  Anesthesia Plan Comments:         Anesthesia Quick Evaluation

## 2019-03-15 NOTE — Interval H&P Note (Signed)
History and Physical Interval Note:  03/15/2019 12:36 PM  [name]  has presented today for surgery, with Paula diagnosis of PELVIC MASS.  Paula various methods of treatment have been discussed with Paula patient and family. After consideration of risks, benefits and other options for treatment, Paula patient has consented to  Procedure(s): XI ROBOTIC ASSISTED TOTAL HYSTERECTOMY WITH BILATERAL SALPINGO OOPHORECTOMY , MINI LAPAROTOMY AND POSSIBLE STAGING (Bilateral) as a surgical intervention.  Paula patient's history has been reviewed, patient examined, no change in status, stable for surgery.  I have reviewed Paula patient's chart and labs.  Questions were answered to Paula patient's satisfaction.     Thereasa Solo

## 2019-03-15 NOTE — Anesthesia Postprocedure Evaluation (Signed)
Anesthesia Post Note  Patient: Paula Acosta  Procedure(s) Performed: EXPLORATORY LAPAROTOMY, TOTAL ABDOMINAL HYSTERECTOMY. OMENTECTOMY     Patient location during evaluation: PACU Anesthesia Type: General Level of consciousness: sedated Pain management: pain level controlled Vital Signs Assessment: post-procedure vital signs reviewed and stable Respiratory status: spontaneous breathing and respiratory function stable Cardiovascular status: stable Postop Assessment: no apparent nausea or vomiting Anesthetic complications: no    Last Vitals:  Vitals:   03/15/19 1630 03/15/19 1645  BP: (!) 143/70 (!) 150/72  Pulse: (!) 59 71  Resp: 12 13  Temp: (!) 36.1 C (!) 36.3 C  SpO2: 100% 100%                 Breckin Zafar DANIEL

## 2019-03-16 ENCOUNTER — Encounter (HOSPITAL_COMMUNITY): Payer: Self-pay | Admitting: Gynecologic Oncology

## 2019-03-16 LAB — CBC
HCT: 35.5 % — ABNORMAL LOW (ref 36.0–46.0)
Hemoglobin: 11.6 g/dL — ABNORMAL LOW (ref 12.0–15.0)
MCH: 32.9 pg (ref 26.0–34.0)
MCHC: 32.7 g/dL (ref 30.0–36.0)
MCV: 100.6 fL — ABNORMAL HIGH (ref 80.0–100.0)
Platelets: 238 10*3/uL (ref 150–400)
RBC: 3.53 MIL/uL — ABNORMAL LOW (ref 3.87–5.11)
RDW: 11.8 % (ref 11.5–15.5)
WBC: 11.1 10*3/uL — ABNORMAL HIGH (ref 4.0–10.5)
nRBC: 0 % (ref 0.0–0.2)

## 2019-03-16 LAB — BASIC METABOLIC PANEL
Anion gap: 9 (ref 5–15)
BUN: 10 mg/dL (ref 8–23)
CO2: 24 mmol/L (ref 22–32)
Calcium: 8.1 mg/dL — ABNORMAL LOW (ref 8.9–10.3)
Chloride: 103 mmol/L (ref 98–111)
Creatinine, Ser: 0.87 mg/dL (ref 0.44–1.00)
GFR calc Af Amer: >60 mL/min (ref >60)
GFR calc non Af Amer: >60 mL/min (ref >60)
Glucose, Bld: 153 mg/dL — ABNORMAL HIGH (ref 70–99)
Potassium: 4.4 mmol/L (ref 3.5–5.1)
Sodium: 136 mmol/L (ref 135–145)

## 2019-03-16 MED ORDER — IBUPROFEN 200 MG PO TABS
600.0000 mg | ORAL_TABLET | Freq: Four times a day (QID) | ORAL | Status: DC | PRN
  Administered 2019-03-16: 11:00:00 600 mg via ORAL
  Filled 2019-03-16: qty 3

## 2019-03-16 NOTE — Progress Notes (Signed)
1 Day Post-Op Procedure(s): EXPLORATORY LAPAROTOMY, TOTAL ABDOMINAL HYSTERECTOMY. OMENTECTOMY  Subjective: Patient reports tolerating sips of liquids.  Ambulated in the hall with assist but reported intermittent dizziness.  No dizziness reported while laying in bed.  Reporting moderate abdominal soreness and states the pain medication is helping. Has ice to the abdomen. Foley removed this am. Due to void. No nausea or emesis reported. Denies chest pain, dyspnea.  Reports one episode of flatus. No concerns voiced.     Objective: Vital signs in last 24 hours: Temp:  [96.8 F (36 C)-98.6 F (37 C)] 98.6 F (37 C) (06/12 0511) Pulse Rate:  [56-83] 78 (06/12 0511) Resp:  [12-17] 15 (06/12 0511) BP: (114-154)/(57-98) 114/63 (06/12 0511) SpO2:  [96 %-100 %] 96 % (06/12 0511) Weight:  [114 lb 6 oz (51.9 kg)] 114 lb 6 oz (51.9 kg) (06/11 1113) Last BM Date: 03/15/19  Intake/Output from previous day: 06/11 0701 - 06/12 0700 In: 4111.2 [P.O.:720; I.V.:3291.2; IV Piggyback:100] Out: 2200 [Urine:2000; Blood:200]  Physical Examination: General: alert, cooperative and no distress Resp: clear to auscultation bilaterally Cardio: regular rate and rhythm, S1, S2 normal, no murmur, click, rub or gallop GI: incision: midline incision with op site dressing in place with no drainage noted underneath and abdomen soft, active bowel sounds, tender, non tympanic Extremities: extremities normal, atraumatic, no cyanosis or edema  Ecchymosis noted around midline incision  Labs: WBC/Hgb/Hct/Plts:  11.1/11.6/35.5/238 (06/12 0518) BUN/Cr/glu/ALT/AST/amyl/lip:  10/0.87/--/--/--/--/-- (06/12 0518)  Assessment: 68 y.o. s/p Procedure(s): EXPLORATORY LAPAROTOMY, TOTAL ABDOMINAL HYSTERECTOMY. OMENTECTOMY: stable Pain:  Pain is well-controlled on PRN medications.  Heme: Hgb 11.6 and Hct 35.5 this am. Pre-op Hgb 14.2 and Hct 42.0.  CV: BP and HR stable post-op. Continue to monitor with ordered vital signs.  GI:   Tolerating po: Yes. Antiemetics ordered if needed. Diet changed to regular.  GU: Adequate output reported. Due to void.    FEN: No critical values this am on Bmet.  Prophylaxis: SCDs and lovenox.  Plan: Diet to regular Due to void Encourage ambulation, IS use Kpad for symptom relief Continue to monitor I&O Continue plan of care per Dr. Denman George If tolerates diet this am, plan to saline lock IV   LOS: 1 day     D  03/16/2019, 7:21 AM

## 2019-03-17 MED ORDER — BISACODYL 10 MG RE SUPP: 10.0000 mg | Freq: Once | RECTAL | Status: DC

## 2019-03-17 NOTE — Discharge Summary (Signed)
Physician Discharge Summary  Patient ID: Paula Acosta MRN: [id number] DOB/AGE: 06-15-1951 68 y.o.  Admit date: 03/15/2019 Discharge date: 03/17/2019  Admission Diagnoses: <principal problem not specified>  Discharge Diagnoses:  Active Problems:   Pelvic mass in female   Discharged Condition: good  Hospital Course: On 03/15/2019, the patient underwent the following: Procedure(s): EXPLORATORY LAPAROTOMY, TOTAL ABDOMINAL HYSTERECTOMY. OMENTECTOMY.   The postoperative course was uneventful.  She was discharged to home on postoperative day 2 tolerating a regular diet, meeting goals for discharge.  Consults: None  Significant Diagnostic Studies: None  Treatments: surgery: See above  Discharge Exam: Blood pressure (!) 142/80, pulse 93, temperature 98.2 F (36.8 C), temperature source Oral, resp. rate 16, height 5\' 2"  (1.575 m), weight 51.9 kg, SpO2 97 %. General appearance: alert Resp: clear to auscultation bilaterally Cardio: regular rate and rhythm, S1, S2 normal, no murmur, click, rub or gallop GI: softly distended, non-tender; bowel sounds normal; no masses,  no organomegaly; small ecchymoses   Extremities: extremities normal, atraumatic, no cyanosis or edema and Homans sign is negative, no sign of DVT Incision/Wound: C/D/I  Disposition: Discharge disposition: 01-Home or Self Care       Discharge Instructions    Activity as tolerated - No restrictions   Complete by: As directed    Call MD for:  extreme fatigue   Complete by: As directed    Call MD for:  persistant dizziness or light-headedness   Complete by: As directed    Call MD for:  persistant nausea and vomiting   Complete by: As directed    Call MD for:  redness, tenderness, or signs of infection (pain, swelling, redness, odor or green/yellow discharge around incision site)   Complete by: As directed    Call MD for:  severe uncontrolled pain   Complete by: As directed    Call MD for:  temperature >100.4    Complete by: As directed    Diet - low sodium heart healthy   Complete by: As directed    Diet general   Complete by: As directed    Discharge instructions   Complete by: As directed    Activity: 1. Be up and out of the bed during the day.  Take a nap if needed.  You may walk up steps but be careful and use the hand rail.  Stair climbing will tire you more than you think, you may need to stop part way and rest.   2. No lifting or straining for 4 weeks.  3. No driving for 1 weeks.  Do Not drive if you are taking narcotic pain medicine.  4. Shower daily.  Use soap and water on your incision and pat dry; don't rub.   5. No sexual activity and nothing in the vagina for 8 weeks.  Medications:  - Take ibuprofen and tylenol first line for pain control. Take these regularly (every 6 hours) to decrease the build up of pain.  - If necessary, for severe pain not relieved by ibuprofen, contact Dr Serita Grit office and you will be prescribed percocet.  - While taking percocet you should take sennakot every night to reduce the likelihood of constipation. If this causes diarrhea, stop its use.  Diet: 1. Low sodium Heart Healthy Diet is recommended.  2. It is safe to use a laxative if you have difficulty moving your bowels.   Wound Care: 1. Keep clean and dry.  Shower daily.  Reasons to call the Doctor:  Fever - Oral temperature  greater than 100.4 degrees Fahrenheit Foul-smelling vaginal discharge Difficulty urinating Nausea and vomiting Increased pain at the site of the incision that is unrelieved with pain medicine. Difficulty breathing with or without chest pain New calf pain especially if only on one side Sudden, continuing increased vaginal bleeding with or without clots.   Follow-up: 1. See Everitt Amber in 4 weeks.  Contacts: For questions or concerns you should contact:  Dr. Everitt Amber at [phone] After hours and on week-ends call [phone] and ask to speak to the  physician on call for Gynecologic Oncology   Discharge wound care:   Complete by: As directed    Keep clean and dry   Driving Restrictions   Complete by: As directed    No driving for 1- 2 weeks   Increase activity slowly   Complete by: As directed    Lifting restrictions   Complete by: As directed    No lifting > 10 lbs for 6 weeks   May shower / Bathe   Complete by: As directed    No tub baths for 6 weeks   Sexual Activity Restrictions   Complete by: As directed    No intercourse for 6 - 8 weeks     Allergies as of 03/17/2019   No Known Allergies     Medication List    TAKE these medications   dicyclomine 10 MG capsule Commonly known as: BENTYL Take 10 mg by mouth 4 (four) times daily.   docusate sodium 100 MG capsule Commonly known as: COLACE Take 100 mg by mouth 2 (two) times daily as needed (constipation.).   Fish Oil 1000 MG Caps Take 1,000 mg by mouth daily.   ibuprofen 600 MG tablet Commonly known as: ADVIL Take 1 tablet (600 mg total) by mouth every 6 (six) hours as needed. For AFTER surgery   losartan 50 MG tablet Commonly known as: COZAAR Take 50 mg by mouth daily.   Lubricant Eye Drops 0.4-0.3 % Soln Generic drug: Polyethyl Glycol-Propyl Glycol Place 1 drop into both eyes 3 (three) times daily as needed (dry/irritated eyes.).   multivitamin with minerals Tabs tablet Take 1 tablet by mouth daily.   oxyCODONE 5 MG immediate release tablet Commonly known as: Oxy IR/ROXICODONE Take 1 tablet (5 mg total) by mouth every 4 (four) hours as needed for severe pain. For AFTER surgery, do not take and drive   PRESERVISION AREDS PO Take 1 tablet by mouth daily.   senna-docusate 8.6-50 MG tablet Commonly known as: Senokot-S Take 2 tablets by mouth at bedtime. For AFTER surgery, do not take if having diarrhea            Discharge Care Instructions  (From admission, onward)         Start     Ordered   03/17/19 0000  Discharge wound care:     Comments: Keep clean and dry   03/17/19 1237         Follow-up Information    Everitt Amber, MD. Schedule an appointment as soon as possible for a visit in 4 day(s).   Specialty: Gynecologic Oncology Contact information: [facility] [address] [phone]           Signed: Lahoma Crocker 03/17/2019, 12:41 PM

## 2019-03-17 NOTE — Progress Notes (Signed)
PIV removed, discharge instructions reviewed with pt and printed copy provided. Pt denies any questions or concerns. Discharged to home with daughter

## 2019-03-17 NOTE — Discharge Instructions (Signed)
Planning for °Recovery and °Going Home °Your Guide to Gynecologic Surgery   ° ° °In-Hospital Recovery Plan °Team Caring for You After Surgery °In addition to the nursing staff on the unit, the gynecological surgery team will °care for you. This team is led by your surgeon and includes medical students and a °physician assistant or nurse practitioner. There will be a physician in the °hospital 24 hours a day to tend to your needs. The students °report directly to your surgeon, who is the one overseeing all of your care. ° °Pain Relief After Surgery °Your pain will be assessed regularly on a scale from 0 to 10. Pain assessment is  °necessary to guide your pain relief. It is essential that you are able to take deep °breaths, cough and move. Prevention or early treatment of pain is far more °effective than trying to treat severe pain. Therefore, we have devised a specialized °regimen to stay ahead of your pain and use almost no narcotics, which can slow °down your recovery process. If you have an epidural catheter, you will receive a  °constant infusion of pain medication through your epidural. If you need °additional pain relief, you will be able to push a button to increase the medication in °your epidural. You will also be given acetaminophen and an ibuprofen-like °medication to keep your pain under control.  You can always ask for additional pain pills if you are not comfortable. In most cases an anesthesiologist with expertise in pain management will visit you every day and help design your pain management plan. ° °One Day After Surgery °Focus on drinking and walking. You will start drinking clear liquids after °surgery. The intravenous fluids will be stopped, and the catheter may be removed  °from your bladder. We expect you to get out of bed, with the nurses' or assistants' °help, sit in a chair for six hours and start to move about in the hallways. You will °also meet with a case manager to assess your discharge  needs, including home °nursing. Your physician may order home care to assist with your transition home.  °Home nursing visits, which are intermittent, help you get readjusted to home by °teaching treatments, monitoring medications, and performing clinical assessment °and reporting back to your physician. Other services may include therapy and °medical equipment; private duty services are also available. If you are going °"home" to a different address upon discharge, please alert us. A Home Care °Coordinator can visit with you while in the hospital to discuss your options. If you °have questions please speak with your case manager. If you °need rehabilitation at a facility, a social worker will assist with this. If you need °rehabilitation at a facility, a social worker will assist with this. °If your procedure was performed in a minimally invasive fashion, you will be discharged to home if your pain is well controlled and you are tolerating a regular diet.   ° ° °Two Days After Surgery °You will start eating a soft diet and change to a more solid diet as you feel up to it. The catheter from your bladder will be removed, if not already done so. If there is a dressing on your wound, it will be removed. The tubing will be disconnected from your IV. °We expect you to be out of bed for the majority of the day and walking at least three times in the hallway, with assistance as needed.  You may be discharged at this point if it is felt you are   ready.  ° °Three Days After Surgery °You continue to eat your low residue diet. You may be ready to go home if you are °drinking enough to keep yourself hydrated, your pain is well controlled, you are not °belching or nauseated, you are passing gas and you are able to get around on your °own. However, we will not discharge you from the hospital until we are sure you are °ready. ° °Discharge °Discharge time is at 10 a.m. You will need to make arrangements for someone to °accompany you  home. You will not be released without someone present. °Please keep in mind that we strive to get patients discharged as quickly as possible, °but there may be delays for a variety of reasons. °Complications That May Delay Discharge: °? Nausea and vomiting: It is very common to feel sick after your surgery. We give °you medication to reduce this. However, if you do feel sick, you should reduce the °amount you are taking by mouth. Small, frequent meals or drinks are best in this  °situation. As long as you can drink and keep yourself hydrated, the nausea will likely °pass.  °Ileus: Following surgery, the bowel can be sluggish, making it difficult for food °and gas to pass through the intestines. This is called an ileus. We have designed our °care program to do everything possible to reduce the likelihood of an ileus. If you do °develop an ileus, it usually only lasts two to three days. However, it may require a °small tube down the nose to decompress the stomach. The best way to avoid an °ileus is to reduce the amount of narcotic pain medications, get up as °much as possible after your surgery, and stimulate the bowel early after °surgery with small amounts of food and liquids. ° Wound infection: If a wound infection develops, this usually happens three °to ten days after surgery.  ° ° Urinary retention: This is if you are unable to urinate after the catheter °from your bladder is removed. The catheter may need to be reinserted until °you are able to urinate on your own. This can be caused by anesthesia, pain °medication and decreased activity.  ° ° °When you are preparing to go home, you will receive: ° Detailed discharge instructions, with information about your operation °and medications  ° ° All prescriptions for medications you need at home; prescriptions can be °filled while you are in the hospital if you would like  ° ° You may be prescribed Lovenox. Lovenox is used to reduce the risk of °developing a blood  clot after surgery. °An appointment to see your surgeon or provider one to two weeks after you °leave the hospital for follow-up  ° °After Discharge °Once you are discharged: °Call us at any time if you are worried about your recovery or if °you should have any questions. °During regular office hours, (8:30 a.m.-4:30 p.m.), and after hours call [phone]. ° °Call us immediately if: ° You have a fever higher than 100.4 degrees.  ° °Your wound is red, more painful or has drainage.  ° ° You are nauseated, vomiting or can't keep liquids down.  ° ° Your pain is worse and not able to be controlled with the regimen you were °sent home with.  ° ° If you are bleeding heavily or have a lot of fluid coming from your vagina. °If you are on narcotics, the goal is to wean you off of them. If you are running low °on supply and need more,   call the nurse a few days before you will run out.  °It is generally easier to reach someone between 8:30 a.m. - 4:30 p.m., so call °early if you think something is not right. A nurse or nurse practitioner is °available every day to answer your questions. After hours and on the °weekends, the calls go to the resident doctors in the hospital. It may take °longer for your phone call to be returned during this time. °If you have a true emergency, such as severe abdominal pain, chest pain, °shortness of breath or any other acute issues, call 911 and go to the local °emergency room. Have them contact our team once you are stable. ° °Concerns After Discharge °Bowel Function Following Your Surgery °Your bowels will take several weeks to settle down and may be unpredictable °at first. Your bowel movements may become loose, or you may be constipated. °For the vast number of patients, this will get back to normal with time. Make °sure you eat nutritious meals, drink plenty of fluids and take regular walks °during the first two weeks after your operation. °Your Guide to Gynecologic Surgery   ° °Abdominal  Pain °It is not unusual to suffer gripping pains (colic) during the first week following °removal of a portion of your bowel. This pain usually lasts for a few minutes °but goes away between spasms. If you have severe pain lasting more than one °to two hours or have a fever and feel generally unwell, you should contact us at  °the telephone contact numbers listed at the end of this packet. °Hysterectomy: °You should have pelvic rest for six (6) weeks or as specified by your doctor °after surgery. You should have nothing in the vagina (no tampons, douching, °intercourse, etc.,) during this time period. °If you have some vaginal spotting, this is normal. If you have heavy bleeding or  °a lot of fluid from your vagina, this is NOT normal and you should contact your °doctor's office or, if after hours, contact the doctor on call. ° °Diarrhea: Fiber and Imodium (Loperamide) °The first step to improving your frequent or loose stools is to bulk up the stool °with fiber. Metamucil is the most common type of fiber that is available at any °drug store. Start with 1 teaspoon mixed into food, like yogurt or oatmeal, in °the morning and evening. Try not to drink any fluid for one hour after you take °the fiber. This will allow the fiber to act like a sponge in your intestines, °soaking up all the excess water. Continue this for three to five days. °You may increase by 1 teaspoon every three to five days until the desired affect, °or you are at 1 tablespoon (3 teaspoons) twice a day. If this doesn't work, you °may try over-the-counter Loperamide, which is an antidiarrheal medication. You °may take one tablet in the morning and evening or 30 minutes before you °typically have diarrhea. You may take up to eight of these tablets daily. It is best °to discuss this with us prior to using this medication. If you have continuous °diarrhea and abdominal cramping, call [phone]. ° °Foley Catheter °Your surgeon may recommend you be  discharged home with a foley catheter °(bladder catheter) for 1 to 2 weeks. Typically this recommendation will be °made for patients undergoing surgery to the lower urinary tract. Before you leave the hospital, your nurse should outfit you with a clip on the inner thigh to secure the catheter to prevent pulling as well as a   small bag that can be easily worn on the upper leg under loose fitting pants and skirts. Your nurse will teach you how to exchange the large bag that typically comes with the catheter for the small bag. You may find it °convenient to attach the small bag when active during the day and then the °large bag when sleeping at night. If there is ever a point when you notice the catheter is not draining urine and youbegin to develop pain behind/above the pubic bone, you should report to the clinic or emergency room immediately as the catheter may be kinked or clogged. Kinking or clogging of the catheter prevents urine from draining °from your bladder. Urine will quickly build up in the bladder and can cause °severe pain as well as seriously disrupt healing if you have undergone surgery °on the lower urinary tract. Additionally, pulling on the catheter can result in °displacement of the balloon at the end of the catheter from inside of to outside  °of the bladder. This also results in severe pain and can cause bleeding. For °this reason, secure the catheter to the clip on your inner thigh at all times as the °clip prevents against pulling. ° °Wound Care °For the first few weeks following surgery, your wound may be slightly red and °uncomfortable. You may shower and let the soapy water wash over your incision. °Avoid soaking in the tub for one month following surgery or until °the wound is well healed. It will take the wound several months to °"soften." It is common to have bumpy areas in the wound near the belly  °button and at the ends of the incision. ° °If you have staples, these should be removed  when you are seen by your °surgeon at the follow-up appointment. You may have a glue-like material on °your incision. Do not pick at this. It will come off over time. It is the °surgical glue used in surgery to close your incision. You also have sutures °inside of you that will dissolve over time ° °Post-Surgery Diet °Attention to good nutrition after surgery is important to your recovery. If °you had no dietary restrictions prior to the surgery, you will have no °special dietary restrictions after the surgery. However, consuming enough °protein, calories, vitamins and minerals is necessary to support healing. °Some patients find their appetite is less than normal after surgery. In this °case, frequent small meals throughout the day may help. °It is not uncommon to lose 10 to 15 pounds after surgery. However, by the °fourth to fifth week, your weight loss should stabilize. °It is normal that certain foods taste different and certain smells may make °you nauseas. °Over time, the amount you can comfortably consume will gradually increase. °You should try to eat a balanced diet, which includes: ° Foods that are soft, moist, and easy to chew and swallow  ° ° Canned or soft-cooked fruits and vegetables  ° °Plenty of soft breads, rice, pasta, potatoes and other starchy foods (lowerfiber  °varieties may be tolerated better initially)  ° ° High-protein foods and beverages, such as meats, eggs, milk, cottage cheese  °or a supplemental nutrition drink like Boost or Ensure  ° ° Drink plenty of fluids-at least 8 to 10 cups per day. This includes water,  °fruit juice, Gatorade, teas/coffee and milk. Drinking plenty is especially °important if you have loose stools (diarrhea). ° ° Avoid drinking a lot of caffeine, since this may dehydrate you.  ° ° Avoid fried, greasy and highly seasoned   or spicy foods.  ° ° Avoid carbonated beverages in the first couple weeks.  ° ° Avoid raw fruits and vegetables.  ° °Hobbies/Activities °Walking  is encouraged after your surgery. You should plan to undertake °regular exercise several times a day and gradually increase this during the °four weeks following your operation until you are back to your normal level °of activity. You may climb stairs. Don't do any heavy lifting greater than 10 °pounds or contact sports for the first month after your surgery. °Generally, you can return to hobbies and activities soon after your surgery. °This will help you recover. °It can take up to two to three months to fully recover. It is not unusual to be °fatigued and require an afternoon nap for up to six to eight weeks following °surgery. Your body is using this energy to heal your wounds. Set small goals °for yourself and try to do a little more each day. ° °Work °It is normal to return to work three to six weeks following your operation. If °your job involves heavy manual work, then you should wait six weeks. °However, you should check with your employer regarding rules, which may °be relevant to your return to work. If you need a return-to-work form for your °employer or disability papers, bring them to your follow-up appointment or °fax them to our office at [phone]. ° °Driving °You may drive when you are off narcotics and pain-free enough to react °quickly with your braking foot. For most patients, this occurs at one to four °weeks following surgery. ° ° °Write down any questions you may have to ask your care team. ° °Important Contact Numbers: °GYN Oncology Office: [phone] °

## 2019-03-19 ENCOUNTER — Telehealth: Payer: Self-pay | Admitting: Gynecologic Oncology

## 2019-03-19 NOTE — Telephone Encounter (Signed)
Post op telephone call to check patient status.  Patient describes expected post operative status.  Adequate PO intake reported.  Bowels and bladder functioning without difficulty.  Pain minimal.  Reportable signs and symptoms reviewed.  Follow up appt confirmed.

## 2019-04-04 ENCOUNTER — Encounter: Payer: Self-pay | Admitting: Gynecologic Oncology

## 2019-04-04 ENCOUNTER — Encounter: Payer: Self-pay | Admitting: *Deleted

## 2019-04-04 ENCOUNTER — Other Ambulatory Visit: Payer: Self-pay

## 2019-04-04 ENCOUNTER — Inpatient Hospital Stay: Payer: BC Managed Care – PPO | Attending: Gynecologic Oncology | Admitting: Gynecologic Oncology

## 2019-04-04 VITALS — BP 142/78 | HR 74 | Temp 98.7°F | Resp 18 | Ht 62.0 in | Wt 112.5 lb

## 2019-04-04 DIAGNOSIS — Z90710 Acquired absence of both cervix and uterus: Secondary | ICD-10-CM | POA: Diagnosis not present

## 2019-04-04 DIAGNOSIS — Z90722 Acquired absence of ovaries, bilateral: Secondary | ICD-10-CM | POA: Diagnosis not present

## 2019-04-04 DIAGNOSIS — D3912 Neoplasm of uncertain behavior of left ovary: Secondary | ICD-10-CM | POA: Diagnosis not present

## 2019-04-04 DIAGNOSIS — Z7189 Other specified counseling: Secondary | ICD-10-CM

## 2019-04-04 NOTE — Progress Notes (Signed)
Follow-up Note: Gyn-Onc  Consult was initially requested by Dr. Matilde Sprang for the evaluation of Paula Acosta 68 y.o. female  CC:  Chief Complaint  Patient presents with  . left ovarian low malignant potential tumor    follow-up    Assessment/Plan:  Paula Acosta  is a 68 y.o.  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 S/p surgical resection (complete) on 03/15/19.  I discussed with Paula Acosta what LMP of the ovary is and the small risk for recurrence in the abdomen.  She will follow-up annually with me for a surveillance examination.  She is doing well postop but will be cleared for return to work on 04/29/19.   HPI: Paula Acosta is a very pleasant 68 year old P1 who is seen in consultation at the request of Dr. Matilde Sprang for a large (15cm) complex cystic mass.  The patient has a longstanding history of IBS.  This became somewhat worse and she thought she had persistent gas pains she also had some urinary frequency.  She was referred to a urologist for evaluation of this.  An abdominal ultrasound was performed after voiding and it was felt initially she might have a poised post void residual.  She then underwent cystoscopy which is grossly normal in the bladder.  Followed by a CT scan of the abdomen and pelvis with IV contrast.  The CT scan was performed on Feb 20, 2019.  It revealed what appeared to be benign hepatic cysts.  No lymphadenopathy, scant pelvic ascites, no omental disease or carcinomatosis.  There is a large complex cystic pelvic mass measuring approximately 15 cm in largest dimension that appears to be arising from the left ovary.  It is somewhat septated with an area of irregular nodular enhancement inferiorly measuring up to 5.4 cm.  The patient is otherwise a fairly healthy woman.  She has some mild hypertension and IBS.  She has had no prior abdominal surgeries.  She has had one prior vaginal delivery.  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  She owns a beach house at [facility] which she rents on air [address] and hopes to retire 2.  Her mother had a diagnosis of leukemia but she has no family history of breast ovarian or colon cancer.  She has no vaginal bleeding symptoms.  Interval Hx:   On March 15, 2019 she underwent an exploratory laparotomy with total abdominal hysterectomy, BSO, omentectomy.  Intraoperative findings were significant for a 17 cm mass arising from the left ovary which was extending retroperitoneally and displacing the sigmoid colon anteriorly.  There was no intraperitoneal rupture during the surgery.  The uterus appeared grossly normal as did the right tube and ovary.  The omentum appeared normal as did the upper abdomen.  Frozen section was consistent with benign versus borderline serous tumor.  Final pathology revealed a stage Ic serous low malignant potential tumor of the left ovary.  The tumor was 15.2 cm.  There was an intact ovarian capsule however surface involvement was present and neoplastic cells were present in the peritoneal washings.  This meant that she was a stage I C2.  There was no borderline tumor found in the omentum uterus or contralateral tube and ovary.  Since surgery she had done well with no specific complaints or issues.  Current Meds:  Outpatient Encounter Medications as of 7/70/2020  Medication Sig  . dicyclomine (  BENTYL) 10 MG capsule Take 10 mg by mouth 4 (four) times daily.  Marland Kitchen docusate sodium (COLACE) 100 MG capsule Take 100 mg by mouth 2 (two) times daily as needed (constipation.).   Marland Kitchen ibuprofen (ADVIL) 600 MG tablet Take 1 tablet (600 mg total) by mouth every 6 (six) hours as needed. For AFTER surgery  . losartan (COZAAR) 50 MG tablet Take 50 mg by mouth daily.  . Multiple Vitamin (MULTIVITAMIN WITH MINERALS) TABS tablet Take 1 tablet by mouth daily.  . Multiple Vitamins-Minerals (PRESERVISION AREDS PO) Take 1 tablet by mouth daily.  .  Omega-3 Fatty Acids (FISH OIL) 1000 MG CAPS Take 1,000 mg by mouth daily.   Marland Kitchen oxyCODONE (OXY IR/ROXICODONE) 5 MG immediate release tablet Take 1 tablet (5 mg total) by mouth every 4 (four) hours as needed for severe pain. For AFTER surgery, do not take and drive  . Polyethyl Glycol-Propyl Glycol (LUBRICANT EYE DROPS) 0.4-0.3 % SOLN Place 1 drop into both eyes 3 (three) times daily as needed (dry/irritated eyes.).  Marland Kitchen senna-docusate (SENOKOT-S) 8.6-50 MG tablet Take 2 tablets by mouth at bedtime. For AFTER surgery, do not take if having diarrhea   No facility-administered encounter medications on file as of 7/70/2020.     Allergy: No Known Allergies  Social Hx:   Social History   Socioeconomic History  . Marital status: Divorced    Spouse name: Not on file  . Number of children: Not on file  . Years of education: Not on file  . Highest education level: Not on file  Occupational History  . Not on file  Social Needs  . Financial resource strain: Not on file  . Food insecurity    Worry: Not on file    Inability: Not on file  . Transportation needs    Medical: Not on file    Non-medical: Not on file  Tobacco Use  . Smoking status: Never Smoker  . Smokeless tobacco: Never Used  Substance and Sexual Activity  . Alcohol use: No  . Drug use: No  . Sexual activity: Yes  Lifestyle  . Physical activity    Days per week: Not on file    Minutes per session: Not on file  . Stress: Not on file  Relationships  . Social Herbalist on phone: Not on file    Gets together: Not on file    Attends religious service: Not on file    Active member of club or organization: Not on file    Attends meetings of clubs or organizations: Not on file    Relationship status: Not on file  . Intimate partner violence    Fear of current or ex partner: Not on file    Emotionally abused: Not on file    Physically abused: Not on file    Forced sexual activity: Not on file  Other Topics Concern   . Not on file  Social History Narrative  . Not on file    Past Surgical Hx:  Past Surgical History:  Procedure Laterality Date  . COLONOSCOPY    . COLONOSCOPY WITH PROPOFOL N/A 02/12/2016   Procedure: COLONOSCOPY WITH PROPOFOL;  Surgeon: Hulen Luster, MD;  Location: [facility] ENDOSCOPY;  Service: Gastroenterology;  Laterality: N/A;  . LAPAROTOMY  03/15/2019   Procedure: EXPLORATORY LAPAROTOMY, TOTAL ABDOMINAL HYSTERECTOMY.;  Surgeon: Everitt Amber, MD;  Location: [facility] OR[facility];  Service: Gynecology;;  . OMENTECTOMY  03/15/2019   Procedure: OMENTECTOMY;  Surgeon: Everitt Amber, MD;  Location: [facility];  Service: Gynecology;;  . TUBAL LIGATION      Past Medical Hx:  Past Medical History:  Diagnosis Date  . Osteoporosis   . Rhinitis     Past Gynecological History:  P1, SVD No LMP recorded. Patient is postmenopausal.  Family Hx:  Family History  Problem Relation Age of Onset  . Stomach cancer Mother   . Leukemia Father   . ALS Sister   . Colon cancer Maternal Aunt   . Colon cancer Maternal Aunt   . Leukemia Cousin   . Breast cancer Neg Hx   . Prostate cancer Neg Hx   . Bladder Cancer Neg Hx   . Kidney cancer Neg Hx     Review of Systems:  Constitutional  Feels well,    ENT Normal appearing ears and nares bilaterally Skin/Breast  No rash, sores, jaundice, itching, dryness Cardiovascular  No chest pain, shortness of breath, or edema  Pulmonary  No cough or wheeze.  Gastro Intestinal  No nausea, vomitting, or diarrhoea. No bright red blood per rectum, no abdominal pain, change in bowel movement, or constipation. Genito Urinary   no hematuria, no bleeding Musculo Skeletal  No myalgia, arthralgia, joint swelling or pain  Neurologic  No weakness, numbness, change in gait,  Psychology  No depression, anxiety, insomnia.   Vitals:  Blood pressure (!) 142/78, pulse 74, temperature 98.7 F (37.1 C), temperature source Oral, resp. rate 18, height 5\' 2"  (1.575 m), weight 112 lb 8 oz (51  kg), SpO2 98 %.  Physical Exam: WD in NAD Neck  Supple NROM, without any enlargements.  Lymph Node Survey No cervical supraclavicular or inguinal adenopathy Cardiovascular  Pulse normal rate, regularity and rhythm. S1 and S2 normal.  Lungs  Clear to auscultation bilateraly, without wheezes/crackles/rhonchi. Good air movement.  Skin  No rash/lesions/breakdown  Psychiatry  Alert and oriented to person, place, and time  Abdomen  Normoactive bowel sounds, abdomen soft, non-tender and thin without evidence of hernia. No masses. Well healed vertical midline incision.  Back No CVA tenderness Genito Urinary  Vaginal cuff in tact with no blood.  Rectal  Good tone, no masses no cul de sac nodularity.  Extremities  No bilateral cyanosis, clubbing or edema.  30 minutes of direct face to face counseling time was spent with the patient. This included discussion about prognosis, therapy recommendations and postoperative side effects and are beyond the scope of routine postoperative care.  Thereasa Solo, MD  04/04/2019, 3:42 PM

## 2019-04-04 NOTE — Patient Instructions (Addendum)
Dr Denman George removed your uterus tubes and ovaries. The pathology showed no cancer but did show a "borderline" tumor of the left ovary which is like a pre-cancer. She will see you once a year in the summer for a check up.  You can return to work on 04/30/19.  Please contact Dr Serita Grit office (at [phone]) in March, 2021 to request an appointment with her for June/July, 2021.

## 2019-04-05 ENCOUNTER — Other Ambulatory Visit: Payer: Self-pay | Admitting: Family Medicine

## 2019-04-05 DIAGNOSIS — Z1231 Encounter for screening mammogram for malignant neoplasm of breast: Secondary | ICD-10-CM

## 2019-05-14 ENCOUNTER — Ambulatory Visit
Admission: RE | Admit: 2019-05-14 | Discharge: 2019-05-14 | Disposition: A | Discharge status: Home / Self Care | Payer: BC Managed Care – PPO | Source: Ambulatory Visit | Attending: Family Medicine | Admitting: Family Medicine

## 2019-05-14 DIAGNOSIS — Z1231 Encounter for screening mammogram for malignant neoplasm of breast: Secondary | ICD-10-CM

## 2019-11-25 ENCOUNTER — Ambulatory Visit: Payer: Medicare Other | Attending: Internal Medicine

## 2019-11-25 DIAGNOSIS — Z23 Encounter for immunization: Secondary | ICD-10-CM

## 2019-11-25 NOTE — Progress Notes (Signed)
   Covid-19 Vaccination Clinic  Name:  Paula Acosta    MRN: [id number] DOB: Aug 24, 1951  11/25/2019  [name] was observed post Covid-19 immunization for 15 minutes without incidence. She was provided with Vaccine Information Sheet and instruction to access the V-Safe system.   [name] was instructed to call 911 with any severe reactions post vaccine: Marland Kitchen Difficulty breathing  . Swelling of your face and throat  . A fast heartbeat  . A bad rash all over your body  . Dizziness and weakness    Immunizations Administered    Name Date Dose VIS Date Route   Pfizer COVID-19 Vaccine 11/25/2019  1:03 PM 0.3 mL 09/14/2019 Intramuscular   Manufacturer: [place]   Lot: [id number]   [place]: [id number]

## 2019-12-19 ENCOUNTER — Ambulatory Visit: Payer: Self-pay | Attending: Internal Medicine

## 2019-12-19 DIAGNOSIS — Z23 Encounter for immunization: Secondary | ICD-10-CM

## 2019-12-19 NOTE — Progress Notes (Signed)
   Covid-19 Vaccination Clinic  Name:  Paula Acosta    MRN: [id number] DOB: 15-Nov-1950  12/19/2019  Ms. Paula Acosta was observed post Covid-19 immunization for 15 minutes without incident. She was provided with Vaccine Information Sheet and instruction to access the V-Safe system.   Ms. Paula Acosta was instructed to call 911 with any severe reactions post vaccine: Marland Kitchen Difficulty breathing  . Swelling of face and throat  . A fast heartbeat  . A bad rash all over body  . Dizziness and weakness   Immunizations Administered    Name Date Dose VIS Date Route   Pfizer COVID-19 Vaccine 12/19/2019  2:21 PM 0.3 mL 09/14/2019 Intramuscular   Manufacturer: [place]   Lot: [id number]   [place]: [id number]

## 2020-04-01 ENCOUNTER — Inpatient Hospital Stay: Payer: Medicare HMO

## 2020-04-01 ENCOUNTER — Inpatient Hospital Stay: Payer: Medicare HMO | Attending: Gynecologic Oncology | Admitting: Gynecologic Oncology

## 2020-04-01 ENCOUNTER — Other Ambulatory Visit: Payer: Self-pay

## 2020-04-01 ENCOUNTER — Encounter: Payer: Self-pay | Admitting: Gynecologic Oncology

## 2020-04-01 VITALS — BP 133/83 | HR 74 | Temp 99.0°F | Resp 20 | Ht 62.0 in | Wt 121.8 lb

## 2020-04-01 DIAGNOSIS — K589 Irritable bowel syndrome without diarrhea: Secondary | ICD-10-CM | POA: Diagnosis not present

## 2020-04-01 DIAGNOSIS — D3912 Neoplasm of uncertain behavior of left ovary: Secondary | ICD-10-CM | POA: Diagnosis present

## 2020-04-01 DIAGNOSIS — Z90722 Acquired absence of ovaries, bilateral: Secondary | ICD-10-CM | POA: Diagnosis not present

## 2020-04-01 DIAGNOSIS — Z90710 Acquired absence of both cervix and uterus: Secondary | ICD-10-CM | POA: Insufficient documentation

## 2020-04-01 DIAGNOSIS — I10 Essential (primary) hypertension: Secondary | ICD-10-CM | POA: Insufficient documentation

## 2020-04-01 NOTE — Progress Notes (Signed)
Follow-up Note: Gyn-Onc  Consult was initially requested by Dr. Matilde Sprang for the evaluation of Paula Acosta 69 y.o. female  CC:  Chief Complaint  Patient presents with  . Ovarian tumor of borderline malignancy, left    Follow Up    Assessment/Plan:  Paula Acosta  is a 69 y.o.  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 S/p surgical resection (complete) on 03/15/19.  No evidence of recurrence.  CA 125 today.  She will follow-up annually with me for a surveillance examination.   HPI: Paula Acosta is a very pleasant 69 year old P1 who is seen in consultation at the request of Dr. Matilde Sprang for a large (15cm) complex cystic mass.  The patient has a longstanding history of IBS.  This became somewhat worse and she thought she had persistent gas pains she also had some urinary frequency.  She was referred to a urologist for evaluation of this.  An abdominal ultrasound was performed after voiding and it was felt initially she might have a poised post void residual.  She then underwent cystoscopy which is grossly normal in the bladder.  Followed by a CT scan of the abdomen and pelvis with IV contrast.  The CT scan was performed on Feb 20, 2019.  It revealed what appeared to be benign hepatic cysts.  No lymphadenopathy, scant pelvic ascites, no omental disease or carcinomatosis.  There is a large complex cystic pelvic mass measuring approximately 15 cm in largest dimension that appears to be arising from the left ovary.  It is somewhat septated with an area of irregular nodular enhancement inferiorly measuring up to 5.4 cm.  The patient is otherwise a fairly healthy woman.  She has some mild hypertension and IBS.  She has had no prior abdominal surgeries.  She has had one prior vaginal delivery.  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  She owns a beach house at [facility] which she rents on air [address] and hopes to  retire 2.  Her mother had a diagnosis of leukemia but she has no family history of breast ovarian or colon cancer.  She has no vaginal bleeding symptoms.  CA 125 on 02/23/19 was elevated at 168.   On March 15, 2019 she underwent an exploratory laparotomy with total abdominal hysterectomy, BSO, omentectomy.  Intraoperative findings were significant for a 17 cm mass arising from the left ovary which was extending retroperitoneally and displacing the sigmoid colon anteriorly.  There was no intraperitoneal rupture during the surgery.  The uterus appeared grossly normal as did the right tube and ovary.  The omentum appeared normal as did the upper abdomen.  Frozen section was consistent with benign versus borderline serous tumor.  Final pathology revealed a stage Ic serous low malignant potential tumor of the left ovary.  The tumor was 15.2 cm.  There was an intact ovarian capsule however surface involvement was present and neoplastic cells were present in the peritoneal washings.  This meant that she was a stage I C2.  There was no borderline tumor found in the omentum uterus or contralateral tube and ovary.  Interval Hx:  No symptoms concerning for recurrence.   Current Meds:  Outpatient Encounter Medications as of 04/01/2020  Medication Sig  . docusate sodium (COLACE) 100 MG capsule Take 100 mg by mouth 2 (two) times daily as needed (constipation.).   Marland Kitchen losartan (COZAAR) 50  MG tablet Take 50 mg by mouth daily.  . Multiple Vitamin (MULTIVITAMIN WITH MINERALS) TABS tablet Take 1 tablet by mouth daily.  . Multiple Vitamins-Minerals (PRESERVISION AREDS PO) Take 1 tablet by mouth daily.  . Omega-3 Fatty Acids (FISH OIL) 1000 MG CAPS Take 1,000 mg by mouth daily.   Vladimir Faster Glycol-Propyl Glycol (LUBRICANT EYE DROPS) 0.4-0.3 % SOLN Place 1 drop into both eyes 3 (three) times daily as needed (dry/irritated eyes.).  . [DISCONTINUED] dicyclomine (BENTYL) 10 MG capsule Take 10 mg by mouth 4 (four) times  daily. (Patient not taking: Reported on 04/01/2020)  . [DISCONTINUED] ibuprofen (ADVIL) 600 MG tablet Take 1 tablet (600 mg total) by mouth every 6 (six) hours as needed. For AFTER surgery (Patient not taking: Reported on 04/01/2020)  . [DISCONTINUED] oxyCODONE (OXY IR/ROXICODONE) 5 MG immediate release tablet Take 1 tablet (5 mg total) by mouth every 4 (four) hours as needed for severe pain. For AFTER surgery, do not take and drive (Patient not taking: Reported on 04/01/2020)  . [DISCONTINUED] senna-docusate (SENOKOT-S) 8.6-50 MG tablet Take 2 tablets by mouth at bedtime. For AFTER surgery, do not take if having diarrhea (Patient not taking: Reported on 04/01/2020)   No facility-administered encounter medications on file as of 04/01/2020.    Allergy: No Known Allergies  Social Hx:   Social History   Socioeconomic History  . Marital status: Divorced    Spouse name: Not on file  . Number of children: Not on file  . Years of education: Not on file  . Highest education level: Not on file  Occupational History  . Not on file  Tobacco Use  . Smoking status: Never Smoker  . Smokeless tobacco: Never Used  Vaping Use  . Vaping Use: Never used  Substance and Sexual Activity  . Alcohol use: No  . Drug use: No  . Sexual activity: Yes  Other Topics Concern  . Not on file  Social History Narrative  . Not on file   Social Determinants of Health   Financial Resource Strain:   . Difficulty of Paying Living Expenses:   Food Insecurity:   . Worried About Charity fundraiser in the Last Year:   . Arboriculturist in the Last Year:   Transportation Needs:   . Film/video editor (Medical):   Marland Kitchen Lack of Transportation (Non-Medical):   Physical Activity:   . Days of Exercise per Week:   . Minutes of Exercise per Session:   Stress:   . Feeling of Stress :   Social Connections:   . Frequency of Communication with Friends and Family:   . Frequency of Social Gatherings with Friends and Family:    . Attends Religious Services:   . Active Member of Clubs or Organizations:   . Attends Archivist Meetings:   Marland Kitchen Marital Status:   Intimate Partner Violence:   . Fear of Current or Ex-Partner:   . Emotionally Abused:   Marland Kitchen Physically Abused:   . Sexually Abused:     Past Surgical Hx:  Past Surgical History:  Procedure Laterality Date  . COLONOSCOPY    . COLONOSCOPY WITH PROPOFOL N/A 02/12/2016   Procedure: COLONOSCOPY WITH PROPOFOL;  Surgeon: Hulen Luster, MD;  Location: [facility] ENDOSCOPY;  Service: Gastroenterology;  Laterality: N/A;  . LAPAROTOMY  03/15/2019   Procedure: EXPLORATORY LAPAROTOMY, TOTAL ABDOMINAL HYSTERECTOMY.;  Surgeon: Everitt Amber, MD;  Location: [facility];  Service: Gynecology;;  . OMENTECTOMY  03/15/2019   Procedure:  OMENTECTOMY;  Surgeon: Everitt Amber, MD;  Location: [facility];  Service: Gynecology;;  . TUBAL LIGATION      Past Medical Hx:  Past Medical History:  Diagnosis Date  . Osteoporosis   . Rhinitis     Past Gynecological History:  P1, SVD No LMP recorded. Patient is postmenopausal.  Family Hx:  Family History  Problem Relation Age of Onset  . Stomach cancer Mother   . Leukemia Father   . ALS Sister   . Colon cancer Maternal Aunt   . Colon cancer Maternal Aunt   . Leukemia Cousin   . Breast cancer Neg Hx   . Prostate cancer Neg Hx   . Bladder Cancer Neg Hx   . Kidney cancer Neg Hx     Review of Systems:  Constitutional  Feels well,    ENT Normal appearing ears and nares bilaterally Skin/Breast  No rash, sores, jaundice, itching, dryness Cardiovascular  No chest pain, shortness of breath, or edema  Pulmonary  No cough or wheeze.  Gastro Intestinal  No nausea, vomitting, or diarrhoea. No bright red blood per rectum, no abdominal pain, change in bowel movement, or constipation. Genito Urinary   no hematuria, no bleeding Musculo Skeletal  No myalgia, arthralgia, joint swelling or pain  Neurologic  No weakness, numbness, change in  gait,  Psychology  No depression, anxiety, insomnia.   Vitals:  Blood pressure 133/83, pulse 74, temperature 99 F (37.2 C), temperature source Oral, resp. rate 20, height 5\' 2"  (1.575 m), weight 121 lb 12.8 oz (55.2 kg), SpO2 99 %.  Physical Exam: WD in NAD Neck  Supple NROM, without any enlargements.  Lymph Node Survey No cervical supraclavicular or inguinal adenopathy Cardiovascular  Pulse normal rate, regularity and rhythm. S1 and S2 normal.  Lungs  Clear to auscultation bilateraly, without wheezes/crackles/rhonchi. Good air movement.  Skin  No rash/lesions/breakdown  Psychiatry  Alert and oriented to person, place, and time  Abdomen  Normoactive bowel sounds, abdomen soft, non-tender and thin without evidence of hernia. No masses. Soft vertical midline incision. Back No CVA tenderness Genito Urinary  Vaginal cuff in tact with no blood.  Rectal  Good tone, no masses no cul de sac nodularity.  Extremities  No bilateral cyanosis, clubbing or edema.  Thereasa Solo, MD  04/01/2020, 1:54 PM

## 2020-04-01 NOTE — Patient Instructions (Signed)
Please notify Dr Denman George at phone number [phone] if you notice vaginal bleeding, new pelvic or abdominal pains, bloating, feeling full easy, or a change in bladder or bowel function.   Please return in 12 months.  Dr Denman George is checking your tumor marker today.

## 2020-04-02 LAB — CA 125: Cancer Antigen (CA) 125: 6.2 U/mL (ref 0.0–38.1)

## 2020-04-03 ENCOUNTER — Telehealth: Payer: Self-pay

## 2020-04-03 NOTE — Telephone Encounter (Signed)
Spoke with patient to inform that CA 125 result is normal at 6.2.  Patient voiced understanding and thanks for call.  No questions/concerns at this time.

## 2020-10-29 ENCOUNTER — Other Ambulatory Visit: Payer: Self-pay | Admitting: Family Medicine

## 2020-10-29 DIAGNOSIS — Z1231 Encounter for screening mammogram for malignant neoplasm of breast: Secondary | ICD-10-CM

## 2020-11-21 ENCOUNTER — Ambulatory Visit
Admission: RE | Admit: 2020-11-21 | Discharge: 2020-11-21 | Disposition: A | Discharge status: Home / Self Care | Payer: Medicare HMO | Source: Ambulatory Visit | Attending: Family Medicine | Admitting: Family Medicine

## 2020-11-21 ENCOUNTER — Other Ambulatory Visit: Payer: Self-pay

## 2020-11-21 DIAGNOSIS — Z1231 Encounter for screening mammogram for malignant neoplasm of breast: Secondary | ICD-10-CM | POA: Insufficient documentation

## 2021-04-07 ENCOUNTER — Telehealth: Payer: Self-pay | Admitting: *Deleted

## 2021-04-07 NOTE — Telephone Encounter (Signed)
Patient called and scheduled a follow up appt with labs for 7/14

## 2021-04-13 ENCOUNTER — Telehealth: Payer: Self-pay | Admitting: *Deleted

## 2021-04-13 NOTE — Telephone Encounter (Signed)
Called and moved her appt to the morning on 7/14

## 2021-04-15 ENCOUNTER — Other Ambulatory Visit: Payer: Self-pay | Admitting: Gynecologic Oncology

## 2021-04-15 ENCOUNTER — Encounter: Payer: Self-pay | Admitting: Gynecologic Oncology

## 2021-04-15 DIAGNOSIS — D3912 Neoplasm of uncertain behavior of left ovary: Secondary | ICD-10-CM

## 2021-04-16 ENCOUNTER — Inpatient Hospital Stay: Payer: Medicare HMO | Attending: Gynecologic Oncology

## 2021-04-16 ENCOUNTER — Other Ambulatory Visit: Payer: Self-pay

## 2021-04-16 ENCOUNTER — Inpatient Hospital Stay (HOSPITAL_BASED_OUTPATIENT_CLINIC_OR_DEPARTMENT_OTHER): Payer: Medicare HMO | Admitting: Gynecologic Oncology

## 2021-04-16 VITALS — BP 123/70 | HR 87 | Temp 98.7°F | Resp 16 | Ht 62.0 in | Wt 113.8 lb

## 2021-04-16 DIAGNOSIS — D3912 Neoplasm of uncertain behavior of left ovary: Secondary | ICD-10-CM | POA: Insufficient documentation

## 2021-04-16 DIAGNOSIS — Z90710 Acquired absence of both cervix and uterus: Secondary | ICD-10-CM | POA: Diagnosis not present

## 2021-04-16 DIAGNOSIS — Z90722 Acquired absence of ovaries, bilateral: Secondary | ICD-10-CM | POA: Insufficient documentation

## 2021-04-16 NOTE — Patient Instructions (Addendum)
Please notify Dr Denman George at phone number [phone] if you notice vaginal bleeding, new pelvic or abdominal pains, bloating, feeling full easy, or a change in bladder or bowel function.   Dr Denman George will call you with your CA 125 result. If elevated, she will order a CT scan.   Dr Denman George is departing the [facility] in October, 2022. Her partners and colleagues including Dr Berline Lopes, Dr Delsa Sale and Joylene John, Nurse Practitioner will be available to continue your care.   You are next scheduled to return to the Gynecologic Oncology office at the [facility] in July, 2023. Please call [phone] in April, 2023 to request an appointment for Dr Lahoma Crocker with Dr Serita Grit partner. Please let them know that you have labs drawn the same day when you come.

## 2021-04-16 NOTE — Progress Notes (Signed)
Follow-up Note: Gyn-Onc  Consult was initially requested by Dr. Matilde Sprang for the evaluation of Paula Acosta 70 y.o. female  CC:  Chief Complaint  Patient presents with   Ovarian tumor of borderline malignancy, left    Assessment/Plan:  Paula Acosta  is a 70 y.o.  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 S/p surgical resection (complete) on 03/15/19.  No evidence of recurrence.  CA 125 today. If trending upward we will order a CT scan. However, if reassuringly stable, she does not require further work-up at this time.  She will follow-up annually with [place] at the [place] [facility] for a surveillance examination. We will have her see my colleague, Dr Lahoma Crocker for follow-up as I am leaving the practice in the fall.    HPI: Paula Acosta is a very pleasant 70 year old P1 who is seen in consultation at the request of Dr. Matilde Sprang for a large (15cm) complex cystic mass.  The patient has a longstanding history of IBS.  This became somewhat worse and she thought she had persistent gas pains she also had some urinary frequency.  She was referred to a urologist for evaluation of this.  An abdominal ultrasound was performed after voiding and it was felt initially she might have a poised post void residual.  She then underwent cystoscopy which is grossly normal in the bladder.  Followed by a CT scan of the abdomen and pelvis with IV contrast.  The CT scan was performed on Feb 20, 2019.  It revealed what appeared to be benign hepatic cysts.  No lymphadenopathy, scant pelvic ascites, no omental disease or carcinomatosis.  There is a large complex cystic pelvic mass measuring approximately 15 cm in largest dimension that appears to be arising from the left ovary.  It is somewhat septated with an area of irregular nodular enhancement inferiorly measuring up to 5.4 cm.  CA 125 on 02/23/19 was elevated at 168.  On March 15, 2019 she underwent an exploratory  laparotomy with total abdominal hysterectomy, BSO, omentectomy.  Intraoperative findings were significant for a 17 cm mass arising from the left ovary which was extending retroperitoneally and displacing the sigmoid colon anteriorly.  There was no intraperitoneal rupture during the surgery.  The uterus appeared grossly normal as did the right tube and ovary.  The omentum appeared normal as did the upper abdomen.  Frozen section was consistent with benign versus borderline serous tumor.  Final pathology revealed a stage Ic serous low malignant potential tumor of the left ovary.  The tumor was 15.2 cm.  There was an intact ovarian capsule however surface involvement was present and neoplastic cells were present in the peritoneal washings.  This meant that she was a stage I C2.  There was no borderline tumor found in the omentum uterus or contralateral tube and ovary.  Interval Hx:  She has had approximately 8lbs weight loss in the past year, and has some bloating symptoms.   Current Meds:  Outpatient Encounter Medications as of 04/16/2021  Medication Sig   Calcium Carbonate-Vitamin D3 (CALCIUM 600+D3) 600-400 MG-UNIT TABS Take 1 tablet by mouth daily.   docusate sodium (COLACE) 100 MG capsule Take 100 mg by mouth daily.   losartan (COZAAR) 50 MG tablet Take 50 mg by mouth daily.   Multiple Vitamin (MULTIVITAMIN WITH MINERALS) TABS tablet Take 1 tablet by mouth daily.   Omega-3 Fatty Acids (FISH OIL) 1000 MG CAPS Take 1,000 mg  by mouth daily.    Polyethyl Glycol-Propyl Glycol 0.4-0.3 % SOLN Place 1 drop into both eyes 3 (three) times daily as needed (dry/irritated eyes.).   [DISCONTINUED] Multiple Vitamins-Minerals (PRESERVISION AREDS PO) Take 1 tablet by mouth daily.   No facility-administered encounter medications on file as of 04/16/2021.    Allergy: No Known Allergies  Social Hx:   Social History   Socioeconomic History   Marital status: Divorced    Spouse name: Not on file   Number of  children: Not on file   Years of education: Not on file   Highest education level: Not on file  Occupational History   Not on file  Tobacco Use   Smoking status: Never   Smokeless tobacco: Never  Vaping Use   Vaping Use: Never used  Substance and Sexual Activity   Alcohol use: No   Drug use: No   Sexual activity: Yes  Other Topics Concern   Not on file  Social History Narrative   Not on file   Social Determinants of Health   Financial Resource Strain: Not on file  Food Insecurity: Not on file  Transportation Needs: Not on file  Physical Activity: Not on file  Stress: Not on file  Social Connections: Not on file  Intimate Partner Violence: Not on file    Past Surgical Hx:  Past Surgical History:  Procedure Laterality Date   COLONOSCOPY     COLONOSCOPY WITH PROPOFOL N/A 02/12/2016   Procedure: COLONOSCOPY WITH PROPOFOL;  Surgeon: Hulen Luster, MD;  Location: [facility];  Service: Gastroenterology;  Laterality: N/A;   LAPAROTOMY  03/15/2019   Procedure: EXPLORATORY LAPAROTOMY, TOTAL ABDOMINAL HYSTERECTOMY.;  Surgeon: Everitt Amber, MD;  Location: [facility] ORS;  Service: Gynecology;;   OMENTECTOMY  03/15/2019   Procedure: OMENTECTOMY;  Surgeon: Everitt Amber, MD;  Location: [facility] ORS;  Service: Gynecology;;   TUBAL LIGATION      Past Medical Hx:  Past Medical History:  Diagnosis Date   Osteoporosis    Rhinitis     Past Gynecological History:  P1, SVD No LMP recorded. Patient is postmenopausal.  Family Hx:  Family History  Problem Relation Age of Onset   Stomach cancer Mother    Leukemia Father    ALS Sister    Colon cancer Maternal Aunt    Colon cancer Maternal Aunt    Leukemia Cousin    Breast cancer Neg Hx    Prostate cancer Neg Hx    Bladder Cancer Neg Hx    Kidney cancer Neg Hx     Review of Systems:  Constitutional  Feels well,    ENT Normal appearing ears and nares bilaterally Skin/Breast  No rash, sores, jaundice, itching, dryness Cardiovascular  No  chest pain, shortness of breath, or edema  Pulmonary  No cough or wheeze.  Gastro Intestinal  No nausea, vomitting, or diarrhoea. No bright red blood per rectum, no abdominal pain, change in bowel movement, or constipation. Genito Urinary   no hematuria, no bleeding Musculo Skeletal  No myalgia, arthralgia, joint swelling or pain  Neurologic  No weakness, numbness, change in gait,  Psychology  No depression, anxiety, insomnia.   Vitals:  Blood pressure 123/70, pulse 87, temperature 98.7 F (37.1 C), temperature source Tympanic, resp. rate 16, height 5\' 2"  (1.575 m), weight 113 lb 12.8 oz (51.6 kg), SpO2 100 %.  Physical Exam: WD in NAD Neck  Supple NROM, without any enlargements.  Lymph Node Survey No cervical supraclavicular or inguinal adenopathy Cardiovascular  Pulse normal rate, regularity and rhythm. S1 and S2 normal.  Lungs  Clear to auscultation bilateraly, without wheezes/crackles/rhonchi. Good air movement.  Skin  No rash/lesions/breakdown  Psychiatry  Alert and oriented to person, place, and time  Abdomen  Normoactive bowel sounds, abdomen soft, non-tender and thin without evidence of hernia. No masses. Soft vertical midline incision. Back No CVA tenderness Genito Urinary  Vaginal cuff in tact with no blood.  Rectal  Good tone, no masses no cul de sac nodularity.  Extremities  No bilateral cyanosis, clubbing or edema.  Thereasa Solo, MD  04/16/2021, 11:17 AM

## 2021-04-17 ENCOUNTER — Telehealth: Payer: Self-pay

## 2021-04-17 LAB — CA 125: Cancer Antigen (CA) 125: 6.8 U/mL (ref 0.0–38.1)

## 2021-04-17 NOTE — Telephone Encounter (Signed)
Told Ms Doto that the CA-125 was stable and WNL at 6.8. Pt verbalized understanding.

## 2021-09-21 ENCOUNTER — Other Ambulatory Visit: Payer: Self-pay | Admitting: Family Medicine

## 2021-09-21 DIAGNOSIS — R61 Generalized hyperhidrosis: Secondary | ICD-10-CM

## 2021-09-21 DIAGNOSIS — D3912 Neoplasm of uncertain behavior of left ovary: Secondary | ICD-10-CM

## 2021-09-21 DIAGNOSIS — R634 Abnormal weight loss: Secondary | ICD-10-CM

## 2021-10-13 ENCOUNTER — Ambulatory Visit
Admission: RE | Admit: 2021-10-13 | Discharge: 2021-10-13 | Disposition: A | Discharge status: Home / Self Care | Payer: Medicare HMO | Source: Ambulatory Visit | Attending: Family Medicine | Admitting: Family Medicine

## 2021-10-13 ENCOUNTER — Other Ambulatory Visit: Payer: Self-pay

## 2021-10-13 DIAGNOSIS — R61 Generalized hyperhidrosis: Secondary | ICD-10-CM

## 2021-10-13 DIAGNOSIS — R634 Abnormal weight loss: Secondary | ICD-10-CM | POA: Diagnosis not present

## 2021-10-13 DIAGNOSIS — D3912 Neoplasm of uncertain behavior of left ovary: Secondary | ICD-10-CM | POA: Diagnosis present

## 2021-10-13 HISTORY — DX: Essential (primary) hypertension: I10

## 2021-10-13 MED ORDER — IOHEXOL 300 MG/ML  SOLN
80.0000 mL | Freq: Once | INTRAMUSCULAR | Status: AC | PRN
  Administered 2021-10-13: 80 mL via INTRAVENOUS

## 2021-10-14 ENCOUNTER — Other Ambulatory Visit: Payer: Self-pay | Admitting: Family Medicine

## 2021-10-14 DIAGNOSIS — R599 Enlarged lymph nodes, unspecified: Secondary | ICD-10-CM

## 2021-11-11 ENCOUNTER — Other Ambulatory Visit: Payer: Self-pay | Admitting: Family Medicine

## 2021-11-11 DIAGNOSIS — Z1231 Encounter for screening mammogram for malignant neoplasm of breast: Secondary | ICD-10-CM

## 2021-12-10 ENCOUNTER — Telehealth: Payer: Self-pay | Admitting: *Deleted

## 2021-12-10 NOTE — Telephone Encounter (Signed)
Returned the patient's call and left a message to call the office back to schedule a follow up appt in April with Dr Delsa Sale ?

## 2021-12-10 NOTE — Telephone Encounter (Signed)
Patient called the office and stated "I will call back in May to schedule a follow up in July."  ?

## 2021-12-11 ENCOUNTER — Other Ambulatory Visit: Payer: Self-pay

## 2021-12-11 ENCOUNTER — Ambulatory Visit
Admission: RE | Admit: 2021-12-11 | Discharge: 2021-12-11 | Disposition: A | Discharge status: Home / Self Care | Payer: Medicare HMO | Source: Ambulatory Visit | Attending: Family Medicine | Admitting: Family Medicine

## 2021-12-11 DIAGNOSIS — Z1231 Encounter for screening mammogram for malignant neoplasm of breast: Secondary | ICD-10-CM

## 2022-02-11 ENCOUNTER — Ambulatory Visit
Admission: RE | Admit: 2022-02-11 | Discharge: 2022-02-11 | Disposition: A | Discharge status: Home / Self Care | Payer: Medicare HMO | Source: Ambulatory Visit | Attending: Family Medicine | Admitting: Family Medicine

## 2022-02-11 ENCOUNTER — Other Ambulatory Visit: Payer: Self-pay

## 2022-02-11 DIAGNOSIS — R599 Enlarged lymph nodes, unspecified: Secondary | ICD-10-CM | POA: Insufficient documentation

## 2022-02-11 LAB — POCT I-STAT CREATININE: Creatinine, Ser: 1 mg/dL (ref 0.44–1.00)

## 2022-02-11 MED ORDER — IOHEXOL 300 MG/ML  SOLN
75.0000 mL | Freq: Once | INTRAMUSCULAR | Status: AC | PRN
  Administered 2022-02-11: 75 mL via INTRAVENOUS

## 2022-04-22 ENCOUNTER — Encounter: Payer: Self-pay | Admitting: Obstetrics & Gynecology

## 2022-04-28 ENCOUNTER — Inpatient Hospital Stay: Payer: Medicare HMO

## 2022-04-28 ENCOUNTER — Inpatient Hospital Stay: Payer: Medicare HMO | Attending: Obstetrics & Gynecology | Admitting: Obstetrics & Gynecology

## 2022-04-28 ENCOUNTER — Encounter: Payer: Self-pay | Admitting: Obstetrics & Gynecology

## 2022-04-28 ENCOUNTER — Other Ambulatory Visit: Payer: Self-pay

## 2022-04-28 VITALS — BP 121/64 | HR 86 | Temp 97.8°F | Resp 16 | Ht 62.0 in | Wt 110.2 lb

## 2022-04-28 DIAGNOSIS — D3912 Neoplasm of uncertain behavior of left ovary: Secondary | ICD-10-CM | POA: Insufficient documentation

## 2022-04-28 NOTE — Progress Notes (Addendum)
Follow Up Note: Gyn-Onc  Paula Acosta 72 y.o. female  CC: She presents for a f/u visit   HPI: The oncology history was reviewed.  Interval History: A CA 125 in 7/22 was 6.8.  Recent 20 lb weight loss.preceded by loss of sense of taste.  CT chest was negative in CTAP in 6/23 was negative.  The weight loss was attributed to GI issues.  She denies abdominal distention, pain, weight loss or change in her bowel habits.     Review of Systems  Review of Systems  Constitutional:  Negative for malaise/fatigue and weight loss.  Respiratory:  Negative for shortness of breath and wheezing.   Cardiovascular:  Negative for chest pain and leg swelling.  Gastrointestinal:  Negative for abdominal pain, blood in stool, constipation, nausea and vomiting.  Genitourinary:  Negative for dysuria, frequency, hematuria and urgency.  Musculoskeletal:  Negative for joint pain and myalgias.  Neurological:  Negative for weakness.  Psychiatric/Behavioral:  Negative for depression. The patient does not have insomnia.    Current medications, allergy, social history, past surgical history, past medical history, family history were all reviewed.    Vitals:   BP 121/64 (BP Location: Left Arm, Patient Position: Sitting)   Pulse 86   Temp 97.8 F (36.6 C) (Tympanic)   Resp 16   Ht '5\' 2"'$  (1.575 m)   Wt 110 lb 3.2 oz (50 kg)   SpO2 100%   BMI 20.16 kg/m    Physical Exam Exam conducted with a chaperone present.  Constitutional:      General: She is not in acute distress. Cardiovascular:     Rate and Rhythm: Normal rate and regular rhythm.  Pulmonary:     Effort: Pulmonary effort is normal.     Breath sounds: Normal breath sounds. No wheezing or rhonchi.  Abdominal:     Palpations: Abdomen is soft.     Tenderness: There is no abdominal tenderness. There is no right CVA tenderness or left CVA tenderness.     Hernia: No hernia is present.  Genitourinary:    General: Normal vulva.     Urethra: No urethral  lesion.     Vagina: No lesions. No bleeding Musculoskeletal:     Cervical back: Neck supple.     Right lower leg: No edema.     Left lower leg: No edema.  Lymphadenopathy:     Upper Body:     Right upper body: No supraclavicular adenopathy.     Left upper body: No supraclavicular adenopathy.     Lower Body: No right inguinal adenopathy. No left inguinal adenopathy.  Skin:    Findings: No rash.  Neurological:     Mental Status: She is oriented to person, place, and time.   Assessment/Plan:  Ovarian tumor of borderline malignancy, left Ms. Paula Acosta  is a 71 y.o.  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 S/p surgical resection (complete) on 03/15/19. Recent weight loss felt to be 2/2 GI issues.  Normal imaging, exam     >re-check CA 125; if negative-->continuel follow-up with [place] prn or in 1 yr    I personally spent 30 minutes face-to-face and non-face-to-face in the care of this patient, which includes all pre, intra, and post visit time on the date of service.   Lahoma Crocker, MD

## 2022-04-28 NOTE — Assessment & Plan Note (Addendum)
Ms. Paula Acosta  is a 71 y.o.  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 S/p surgical resection (complete) on 03/15/19. Recent weight loss felt to be 2/2 GI issues.  Normal imaging, exam   >re-check CA 125; if negative-->continuel follow-up with [place] prn or in 1 yr

## 2022-04-28 NOTE — Patient Instructions (Signed)
Return in 1 yr 

## 2022-04-29 ENCOUNTER — Telehealth: Payer: Self-pay

## 2022-04-29 LAB — CA 125: Cancer Antigen (CA) 125: 7.6 U/mL (ref 0.0–38.1)

## 2022-04-29 NOTE — Telephone Encounter (Signed)
Pt aware of CA125 being stable and within normal range. She was thankful for the call.

## 2023-01-24 ENCOUNTER — Other Ambulatory Visit: Payer: Self-pay | Admitting: Family Medicine

## 2023-01-24 DIAGNOSIS — Z1231 Encounter for screening mammogram for malignant neoplasm of breast: Secondary | ICD-10-CM

## 2023-02-14 ENCOUNTER — Ambulatory Visit
Admission: RE | Admit: 2023-02-14 | Discharge: 2023-02-14 | Disposition: A | Discharge status: Home / Self Care | Payer: Medicare HMO | Source: Ambulatory Visit | Attending: Family Medicine | Admitting: Family Medicine

## 2023-02-14 DIAGNOSIS — Z1231 Encounter for screening mammogram for malignant neoplasm of breast: Secondary | ICD-10-CM

## 2023-03-22 ENCOUNTER — Telehealth: Payer: Self-pay | Admitting: *Deleted

## 2023-03-22 NOTE — Telephone Encounter (Signed)
Paula Acosta called the office to schedule a yearly follow up appt. With Dr. Tamela Oddi. Patient was scheduled for July 24 th at 11:15, with arrival time 11:00 for check in. Patient agreed to date and time and had no further concerns or questions at this time.

## 2023-04-21 ENCOUNTER — Encounter: Payer: Self-pay | Admitting: Obstetrics & Gynecology

## 2023-04-27 ENCOUNTER — Encounter: Payer: Self-pay | Admitting: Obstetrics & Gynecology

## 2023-04-27 ENCOUNTER — Other Ambulatory Visit: Payer: Self-pay

## 2023-04-27 ENCOUNTER — Inpatient Hospital Stay: Payer: Medicare HMO

## 2023-04-27 ENCOUNTER — Inpatient Hospital Stay: Payer: Medicare HMO | Attending: Obstetrics & Gynecology | Admitting: Obstetrics & Gynecology

## 2023-04-27 VITALS — BP 135/68 | HR 77 | Temp 97.5°F | Resp 18 | Ht 62.0 in | Wt 110.4 lb

## 2023-04-27 DIAGNOSIS — Z86018 Personal history of other benign neoplasm: Secondary | ICD-10-CM | POA: Insufficient documentation

## 2023-04-27 DIAGNOSIS — R971 Elevated cancer antigen 125 [CA 125]: Secondary | ICD-10-CM | POA: Insufficient documentation

## 2023-04-27 DIAGNOSIS — D3912 Neoplasm of uncertain behavior of left ovary: Secondary | ICD-10-CM

## 2023-04-27 NOTE — Patient Instructions (Signed)
Return in 1 year ?

## 2023-04-27 NOTE — Assessment & Plan Note (Signed)
Ovarian tumor of borderline malignancy, left Ms. Paula Acosta  is a 72 y.o.  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 S/p surgical resection (complete) on 03/15/19. Negative symptom review, normal exam.  No evidence of recurrence       >continue annual follow-up

## 2023-04-27 NOTE — Progress Notes (Signed)
Follow Up Note: Gyn-Onc  Paula Acosta 72 y.o. female  CC: She presents for a f/u visit   HPI: The oncology history was reviewed.  Interval History: A CA 125 in 7/23 was 7.6.  She denies abdominal distention, pain, weight loss or change in her bowel habits.     Review of Systems  Review of Systems  Constitutional:  Negative for malaise/fatigue and weight loss.  Respiratory:  Negative for shortness of breath and wheezing.   Cardiovascular:  Negative for chest pain and leg swelling.  Gastrointestinal:  Negative for abdominal pain, blood in stool, constipation, nausea and vomiting.  Genitourinary:  Negative for dysuria, frequency, hematuria and urgency.  Musculoskeletal:  Negative for joint pain and myalgias.  Neurological:  Negative for weakness.  Psychiatric/Behavioral:  Negative for depression. The patient does not have insomnia.    Current medications, allergy, social history, past surgical history, past medical history, family history were all reviewed.    Vitals:   BP 135/68 (BP Location: Right Arm, Patient Position: Sitting)   Pulse 77   Temp (!) 97.5 F (36.4 C) (Oral)   Resp 18   Ht 5\' 2"  (1.575 m)   Wt 110 lb 6.4 oz (50.1 kg)   SpO2 100%   BMI 20.19 kg/m    Physical Exam Exam conducted with a chaperone present.  Constitutional:      General: She is not in acute distress. Cardiovascular:     Rate and Rhythm: Normal rate and regular rhythm.  Pulmonary:     Effort: Pulmonary effort is normal.     Breath sounds: Normal breath sounds. No wheezing or rhonchi.  Abdominal:     Palpations: Abdomen is soft.     Tenderness: There is no abdominal tenderness. There is no right CVA tenderness or left CVA tenderness.     Hernia: No hernia is present.  Genitourinary:    General: Normal vulva.     Urethra: No urethral lesion.     Vagina: No lesions. No bleeding Musculoskeletal:     Cervical back: Neck supple.     Right lower leg: No edema.     Left lower leg: No edema.   Lymphadenopathy:     Upper Body:     Right upper body: No supraclavicular adenopathy.     Left upper body: No supraclavicular adenopathy.     Lower Body: No right inguinal adenopathy. No left inguinal adenopathy.  Skin:    Findings: No rash.  Neurological:     Mental Status: She is oriented to person, place, and time.   Assessment/Plan:   Ovarian tumor of borderline malignancy, left Ms. Paula Acosta  is a 72 y.o.  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 S/p surgical resection (complete) on 03/15/19. Negative symptom review, normal exam.  No evidence of recurrence       >continue annual follow-up        I personally spent 25 minutes face-to-face and non-face-to-face in the care of this patient, which includes all pre, intra, and post visit time on the date of service.    Antionette Char, MD

## 2023-04-28 LAB — CA 125: Cancer Antigen (CA) 125: 6.5 U/mL (ref 0.0–38.1)

## 2023-05-23 ENCOUNTER — Telehealth: Payer: Self-pay | Admitting: *Deleted

## 2023-05-23 NOTE — Telephone Encounter (Signed)
Attempted to reach patient in regards to results relayed from Warner Mccreedy, NP. Left voicemail requesting call back.

## 2023-05-24 NOTE — Telephone Encounter (Signed)
-----   Message from Doylene Bode sent at 05/23/2023  2:50 PM EDT ----- Please let the patient know her recent CA 125 level from 04/27/23 was 6.5. This is within normal range and stable compared to previous values.

## 2023-05-24 NOTE — Telephone Encounter (Signed)
Spoke with Paula Acosta and relayed message from Warner Mccreedy, NP that patient's CA 125 on 7/24 was 6.5 and this is within normal range and stable compared to previous values. Pt thanked the office for calling and had no concerns or questions at this time.

## 2023-05-24 NOTE — Telephone Encounter (Signed)
2nd attempt to reach patient in regards to lab results of CA 125. Left voicemail requesting call back to [phone].

## 2023-11-08 ENCOUNTER — Other Ambulatory Visit: Payer: Self-pay | Admitting: Medical Genetics

## 2024-01-06 IMAGING — MG MM DIGITAL SCREENING BILAT W/ TOMO AND CAD
8 series · 9 of 24 positions shown · non-contrast
Comparison: Previous exam(s).

CLINICAL DATA: Screening.

EXAM:
DIGITAL SCREENING BILATERAL MAMMOGRAM WITH TOMOSYNTHESIS AND CAD
TECHNIQUE: Bilateral screening digital craniocaudal and mediolateral oblique
mammograms were obtained. Bilateral screening digital breast
tomosynthesis was performed. The images were evaluated with
computer-aided detection.

[L CC synth-2D]
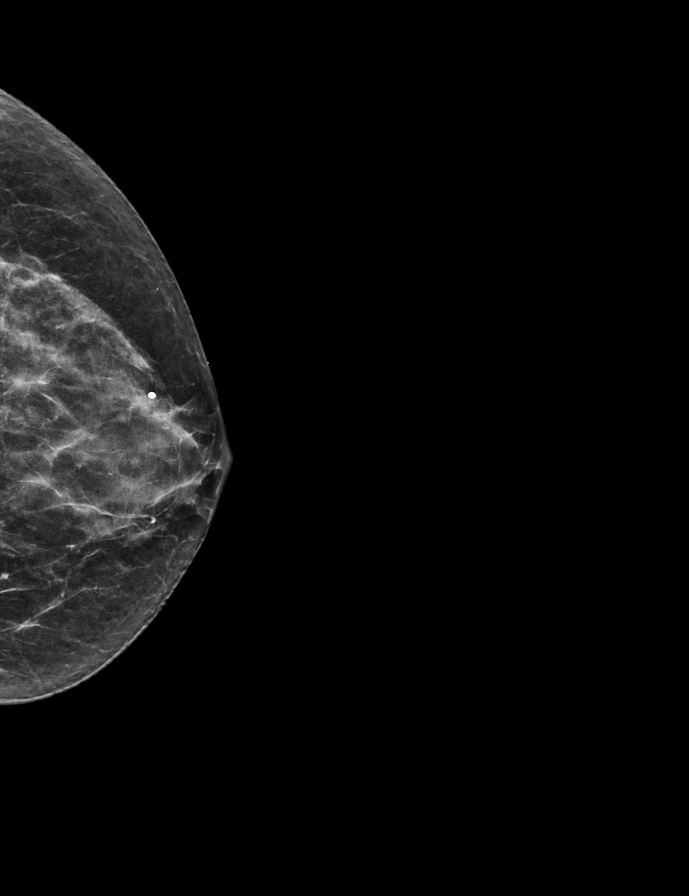

[R CC synth-2D]
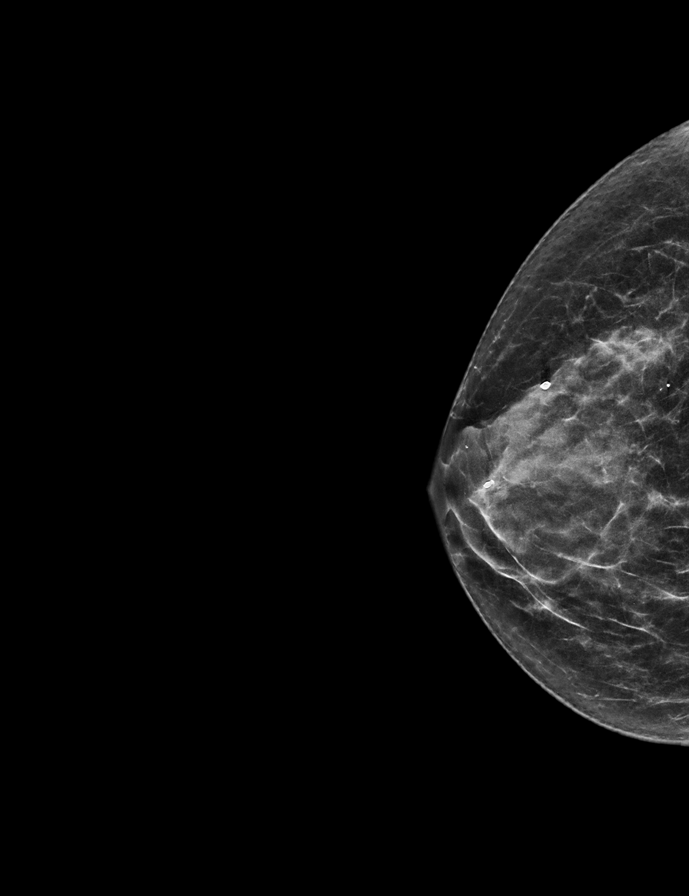

[L MLO synth-2D]
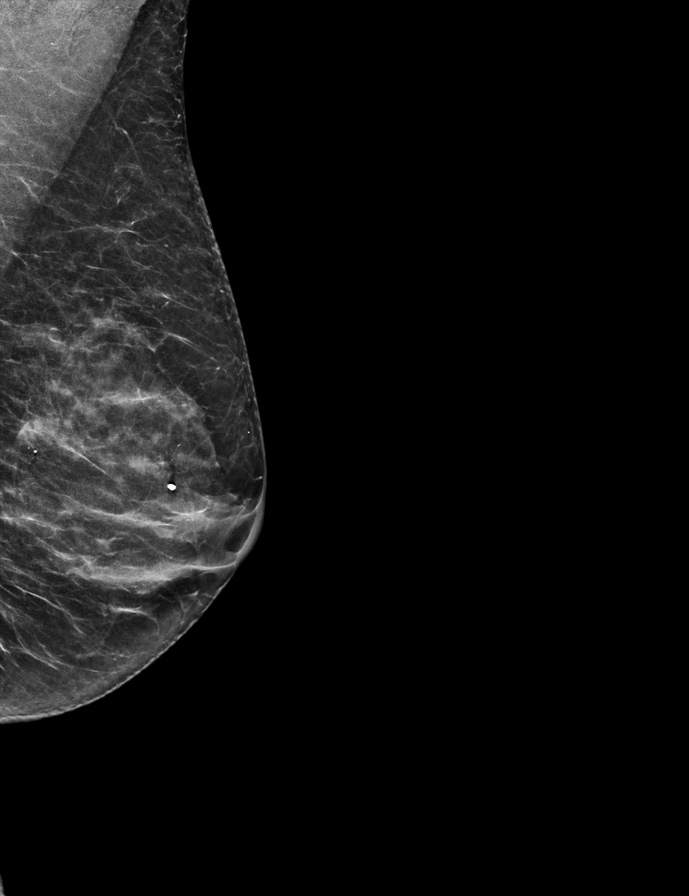

[R MLO synth-2D]
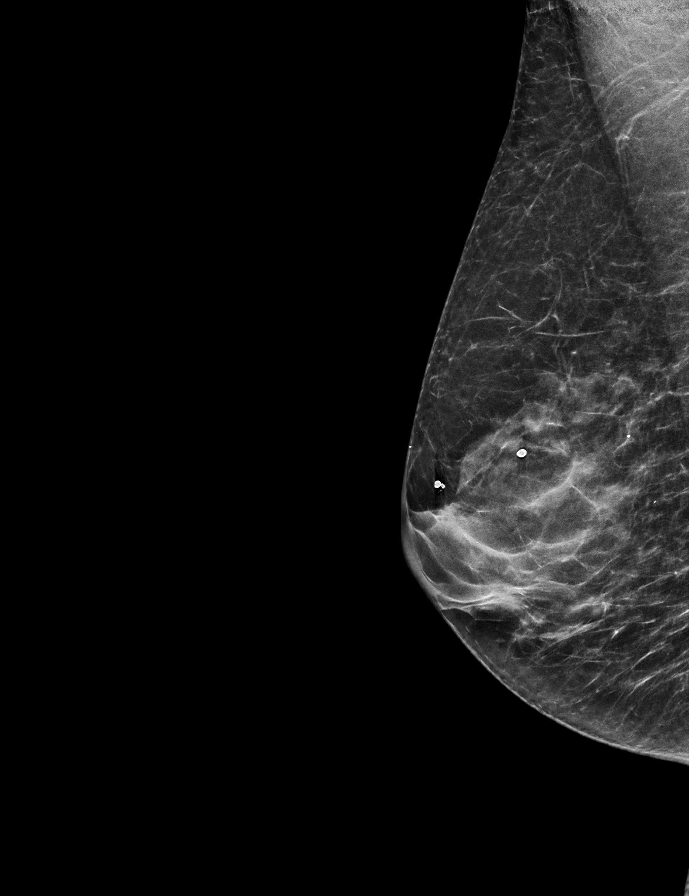

[R CC tomo · 2 of 54 frames shown]
[frame 18/54]
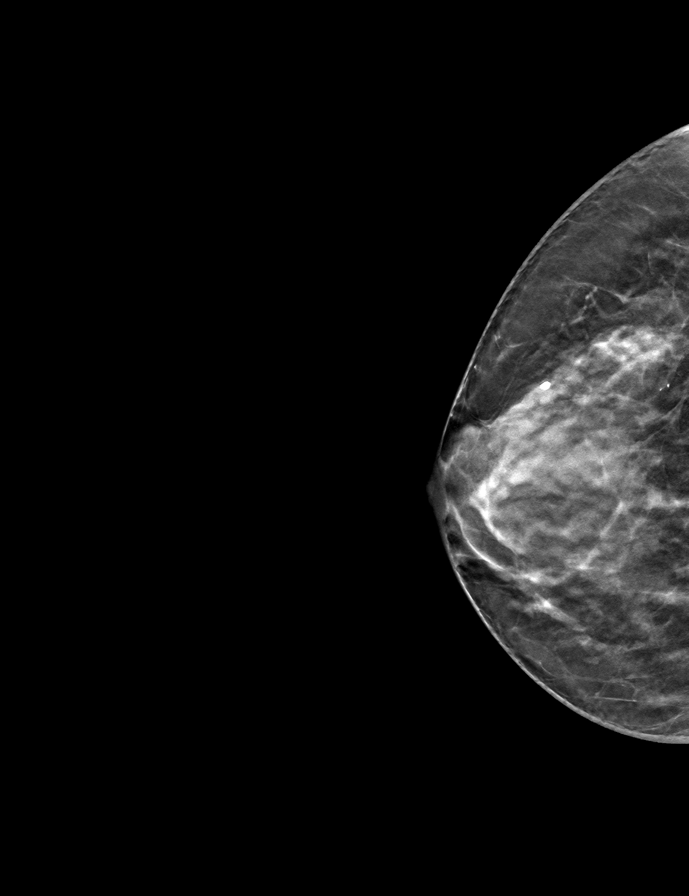
[frame 27/54]
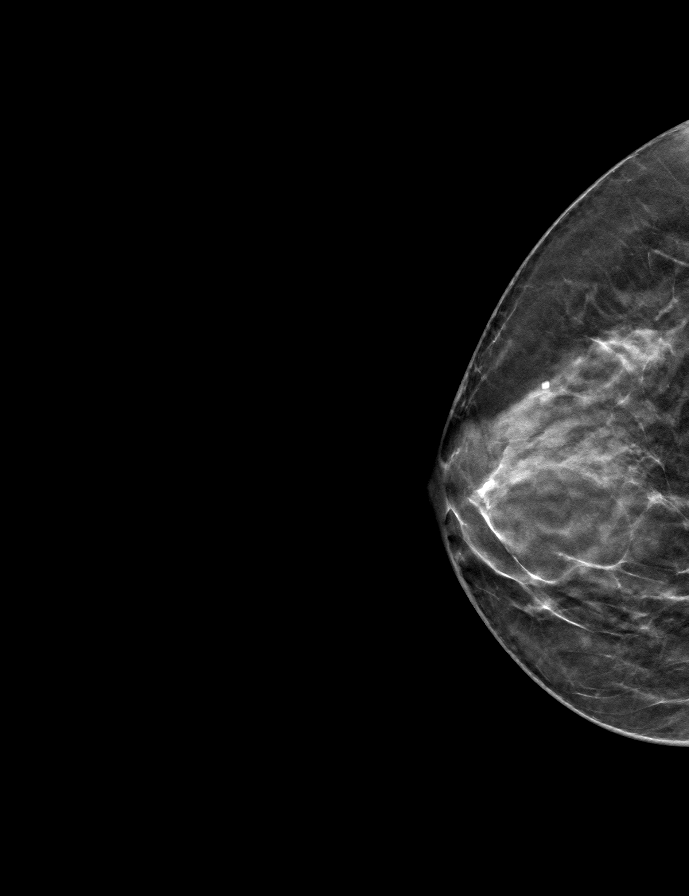

[L CC tomo · tomo slice 27/52.0]
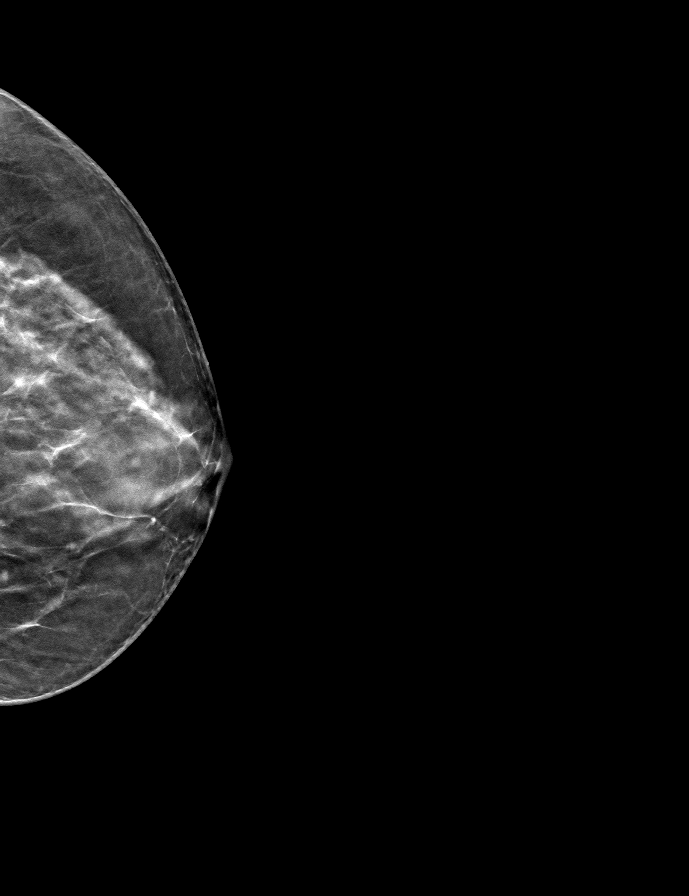

[R MLO tomo · tomo slice 25/49.0]
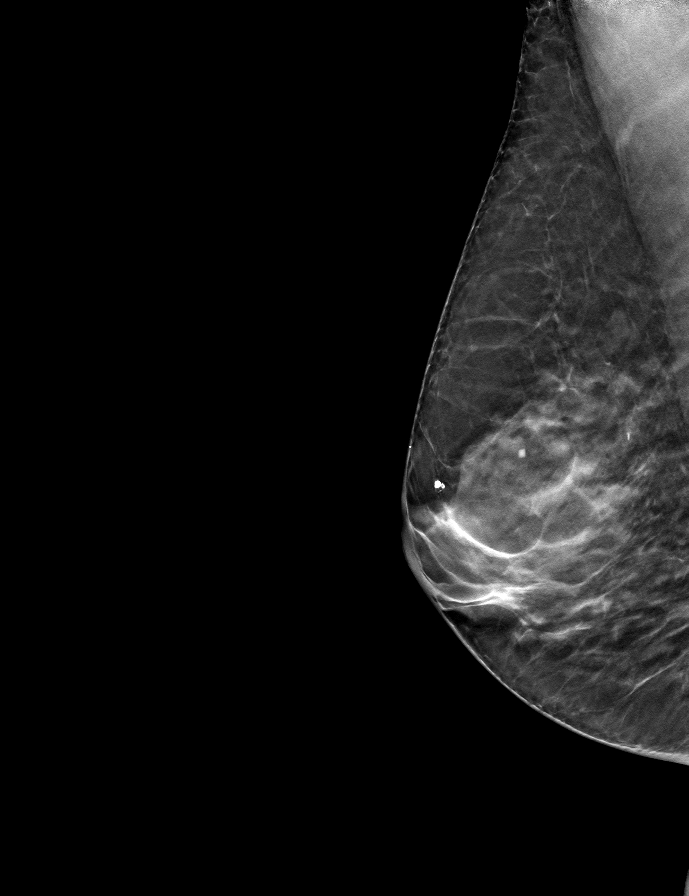

[L MLO tomo · tomo slice 26/51.0]
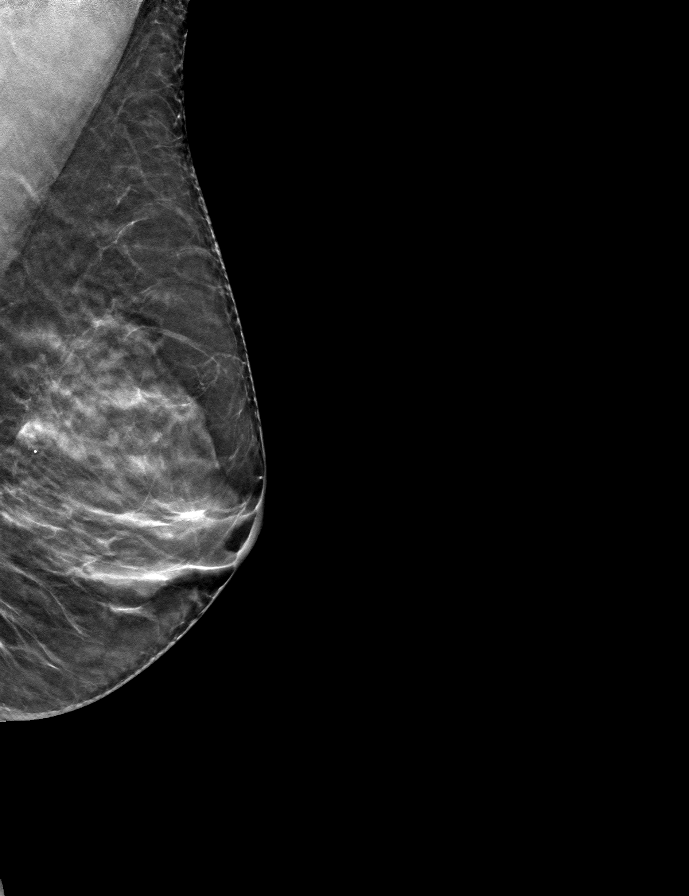

[9 of 24 positions shown; findings below may reference images not displayed]

ACR Breast Density Category c: The breast tissue is heterogeneously
dense, which may obscure small masses.
FINDINGS: There are no findings suspicious for malignancy.
IMPRESSION: No mammographic evidence of malignancy. A result letter of this
screening mammogram will be mailed directly to the patient.

RECOMMENDATION:
Screening mammogram in one year. (Code:Q3-W-BC3)

BI-RADS CATEGORY  1: Negative.

## 2024-01-18 ENCOUNTER — Other Ambulatory Visit: Payer: Self-pay | Admitting: Family Medicine

## 2024-01-18 DIAGNOSIS — Z1231 Encounter for screening mammogram for malignant neoplasm of breast: Secondary | ICD-10-CM

## 2024-03-01 ENCOUNTER — Ambulatory Visit
Admission: RE | Admit: 2024-03-01 | Discharge: 2024-03-01 | Disposition: A | Discharge status: Home / Self Care | Source: Ambulatory Visit | Attending: Family Medicine | Admitting: Family Medicine

## 2024-03-01 DIAGNOSIS — Z1231 Encounter for screening mammogram for malignant neoplasm of breast: Secondary | ICD-10-CM | POA: Diagnosis present

## 2024-03-23 ENCOUNTER — Telehealth: Payer: Self-pay | Admitting: *Deleted

## 2024-03-23 NOTE — Telephone Encounter (Signed)
 Spoke with Paula Acosta who called the office to schedule her annual follow up with Dr. Gaylin Ke. Pt was given an appt.for Wednesday, July 30 th at 1100. Pt agreed to date and time.

## 2024-05-01 ENCOUNTER — Encounter: Payer: Self-pay | Admitting: Obstetrics & Gynecology

## 2024-05-02 ENCOUNTER — Encounter: Payer: Self-pay | Admitting: Obstetrics & Gynecology

## 2024-05-02 ENCOUNTER — Inpatient Hospital Stay (HOSPITAL_BASED_OUTPATIENT_CLINIC_OR_DEPARTMENT_OTHER): Admitting: Obstetrics & Gynecology

## 2024-05-02 ENCOUNTER — Inpatient Hospital Stay: Attending: Obstetrics & Gynecology

## 2024-05-02 VITALS — BP 136/71 | HR 77 | Temp 98.2°F | Resp 18 | Wt 121.2 lb

## 2024-05-02 DIAGNOSIS — D3912 Neoplasm of uncertain behavior of left ovary: Secondary | ICD-10-CM

## 2024-05-02 DIAGNOSIS — Z8603 Personal history of neoplasm of uncertain behavior: Secondary | ICD-10-CM

## 2024-05-02 DIAGNOSIS — R971 Elevated cancer antigen 125 [CA 125]: Secondary | ICD-10-CM | POA: Diagnosis not present

## 2024-05-02 NOTE — Progress Notes (Signed)
 Follow Up Note: Gyn-Onc  Paula Acosta 73 y.o. female  CC: She presents for a f/u visit   HPI: The oncology history was reviewed.  Interval History: A CA 125 in 7/24 was 6.5.  She experiences significant bloating and abdominal discomfort, which she attributes to her IBS. She recently resumed a medication after a month, which provided some relief. She speculates that increased water intake might be contributing to her symptoms.She denies abdominal distention, pain, weight loss.  Discussed the use of AI scribe software for clinical note transcription with the patient, who gave verbal consent to proceed.       Review of Systems  Review of Systems  Constitutional:  Negative for malaise/fatigue and weight loss.  Respiratory:  Negative for shortness of breath and wheezing.   Cardiovascular:  Negative for chest pain and leg swelling.  Gastrointestinal:  See above  Genitourinary:  Negative for dysuria, frequency, hematuria and urgency.  Musculoskeletal:  Negative for joint pain and myalgias.  Neurological:  Negative for weakness.  Psychiatric/Behavioral:  Negative for depression. The patient does not have insomnia.    Current medications, allergy, social history, past surgical history, past medical history, family history were all reviewed.    Vitals:   BP 136/71 (BP Location: Left Arm, Patient Position: Sitting)   Pulse 77   Temp 98.2 F (36.8 C) (Oral)   Resp 18   Wt 121 lb 3.2 oz (55 kg)   SpO2 100%   BMI 22.17 kg/m    Physical Exam Exam conducted with a chaperone present.  Constitutional:      General: She is not in acute distress. Cardiovascular:     Rate and Rhythm: Normal rate and regular rhythm.  Pulmonary:     Effort: Pulmonary effort is normal.     Breath sounds: Normal breath sounds. No wheezing or rhonchi.  Abdominal:     Palpations: Abdomen is soft.     Tenderness: There is no abdominal tenderness. There is no right CVA tenderness or left CVA tenderness.      Hernia: No hernia is present.  Genitourinary:    General: Normal vulva.     Urethra: No urethral lesion.     Vagina: No lesions. No bleeding Musculoskeletal:     Cervical back: Neck supple.     Right lower leg: No edema.     Left lower leg: No edema.  Lymphadenopathy:     Upper Body:     Right upper body: No supraclavicular adenopathy.     Left upper body: No supraclavicular adenopathy.     Lower Body: No right inguinal adenopathy. No left inguinal adenopathy.  Skin:    Findings: No rash.  Neurological:     Mental Status: She is oriented to person, place, and time.   Assessment/Plan:   Ovarian tumor of borderline malignancy, left Ms. Paula Acosta  is a 73 y.o.  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 S/P surgical resection (complete) on 03/15/19. Negative symptom review, normal exam.  No evidence of recurrence    ->continue annual follow-up with a generalist gynecologist       I personally spent 25 minutes face-to-face and non-face-to-face in the care of this patient, which includes all pre, intra, and post visit time on the date of service.    [place], MD

## 2024-05-02 NOTE — Patient Instructions (Addendum)
 Plan on having a CA 125 today and you will be notified of the results.   Congratulations on being 5 years out from diagnosis.   Recommendation includes follow up with a gynecologist at least annually for your continued well woman care or you can continue with follow up with our office in one year.   Please reach out for any needs or concerns.

## 2024-05-02 NOTE — Assessment & Plan Note (Signed)
 Ovarian tumor of borderline malignancy, left Ms. Paula Acosta  is a 73 y.o.  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 S/p surgical resection (complete) on 03/15/19. Negative symptom review, normal exam.  No evidence of recurrence       >continue annual follow-up   Visits every 3-12 mo for up to 5 y,  then as clinically indicated NCCN

## 2024-05-03 ENCOUNTER — Ambulatory Visit: Payer: Self-pay | Admitting: Gynecologic Oncology

## 2024-05-03 LAB — CA 125: Cancer Antigen (CA) 125: 6.9 U/mL (ref 0.0–38.1)

## 2024-05-03 NOTE — Telephone Encounter (Signed)
 LVM for Ms. Shiley to call office regarding normal CA125 as reported by Eleanor Epps NP

## 2024-05-03 NOTE — Telephone Encounter (Signed)
-----   Message from Eleanor JONETTA Epps sent at 05/03/2024 11:12 AM EDT ----- Please let her know: Your CA 125 level is within normal range and stable compared to previous values. ----- Message ----- From: Rebecka, [facility] [place] Sent: 05/03/2024   8:36 AM EDT To: Eleanor JONETTA Epps, NP

## 2024-05-03 NOTE — Telephone Encounter (Signed)
 Pt returned call and is aware of normal result

## 2024-08-13 ENCOUNTER — Other Ambulatory Visit: Payer: Self-pay | Admitting: Medical Genetics

## 2024-08-13 DIAGNOSIS — Z006 Encounter for examination for normal comparison and control in clinical research program: Secondary | ICD-10-CM

## 2024-09-05 LAB — GENECONNECT MOLECULAR SCREEN: Genetic Analysis Overall Interpretation: NEGATIVE

## 2024-09-18 ENCOUNTER — Encounter (INDEPENDENT_AMBULATORY_CARE_PROVIDER_SITE_OTHER): Payer: Self-pay
